# Patient Record
Sex: Female | Born: 1945 | Race: White | Hispanic: No | Marital: Single | Smoking: Former smoker
Health system: Southern US, Community
[De-identification: ages and names within clinical notes are randomized; demographics above are authoritative.]

## PROBLEM LIST (undated history)

## (undated) DIAGNOSIS — J45909 Unspecified asthma, uncomplicated: Secondary | ICD-10-CM

## (undated) DIAGNOSIS — E790 Hyperuricemia without signs of inflammatory arthritis and tophaceous disease: Secondary | ICD-10-CM

## (undated) DIAGNOSIS — F419 Anxiety disorder, unspecified: Secondary | ICD-10-CM

## (undated) DIAGNOSIS — K219 Gastro-esophageal reflux disease without esophagitis: Secondary | ICD-10-CM

## (undated) DIAGNOSIS — N189 Chronic kidney disease, unspecified: Secondary | ICD-10-CM

## (undated) DIAGNOSIS — M810 Age-related osteoporosis without current pathological fracture: Secondary | ICD-10-CM

## (undated) DIAGNOSIS — F32A Depression, unspecified: Secondary | ICD-10-CM

## (undated) DIAGNOSIS — I10 Essential (primary) hypertension: Secondary | ICD-10-CM

## (undated) DIAGNOSIS — E039 Hypothyroidism, unspecified: Secondary | ICD-10-CM

## (undated) DIAGNOSIS — M79609 Pain in unspecified limb: Secondary | ICD-10-CM

## (undated) DIAGNOSIS — F329 Major depressive disorder, single episode, unspecified: Secondary | ICD-10-CM

## (undated) HISTORY — DX: Hypothyroidism, unspecified: E03.9

## (undated) HISTORY — DX: Major depressive disorder, single episode, unspecified: F32.9

## (undated) HISTORY — DX: Hyperuricemia without signs of inflammatory arthritis and tophaceous disease: E79.0

## (undated) HISTORY — PX: TONSILLECTOMY: SHX5217

## (undated) HISTORY — DX: Depression, unspecified: F32.A

## (undated) HISTORY — PX: LUMBAR FUSION: SHX111

## (undated) HISTORY — DX: Age-related osteoporosis without current pathological fracture: M81.0

## (undated) HISTORY — DX: Pain in unspecified limb: M79.609

## (undated) HISTORY — DX: Essential (primary) hypertension: I10

---

## 2012-05-06 ENCOUNTER — Ambulatory Visit: Payer: Self-pay | Admitting: Family Medicine

## 2012-05-14 ENCOUNTER — Ambulatory Visit: Payer: Self-pay | Admitting: Family Medicine

## 2012-08-19 ENCOUNTER — Ambulatory Visit: Payer: Self-pay | Admitting: Gastroenterology

## 2012-08-19 LAB — CBC WITH DIFFERENTIAL/PLATELET
Basophil #: 0.1 10*3/uL (ref 0.0–0.1)
Eosinophil #: 0 10*3/uL (ref 0.0–0.7)
Eosinophil %: 0.2 %
HCT: 38.3 % (ref 35.0–47.0)
HGB: 13.3 g/dL (ref 12.0–16.0)
Lymphocyte #: 1 10*3/uL (ref 1.0–3.6)
Lymphocyte %: 10.1 %
MCHC: 34.6 g/dL (ref 32.0–36.0)
Monocyte %: 6.6 %
Neutrophil %: 82.4 %
RBC: 3.76 10*6/uL — ABNORMAL LOW (ref 3.80–5.20)
RDW: 13.7 % (ref 11.5–14.5)
WBC: 9.8 10*3/uL (ref 3.6–11.0)

## 2012-08-19 LAB — PROTIME-INR
INR: 0.9
Prothrombin Time: 13 secs (ref 11.5–14.7)

## 2012-08-21 LAB — PATHOLOGY REPORT

## 2012-08-28 HISTORY — PX: COLONOSCOPY: SHX174

## 2012-09-18 ENCOUNTER — Other Ambulatory Visit: Payer: Self-pay | Admitting: Gastroenterology

## 2012-09-18 ENCOUNTER — Ambulatory Visit: Payer: Self-pay | Admitting: Surgery

## 2012-09-18 LAB — TSH: Thyroid Stimulating Horm: 0.795 u[IU]/mL

## 2012-09-18 LAB — BASIC METABOLIC PANEL
BUN: 17 mg/dL (ref 7–18)
Chloride: 106 mmol/L (ref 98–107)
Creatinine: 0.9 mg/dL (ref 0.60–1.30)
EGFR (African American): >60
EGFR (Non-African Amer.): >60
Potassium: 3.8 mmol/L (ref 3.5–5.1)

## 2012-09-18 LAB — HEPATIC FUNCTION PANEL A (ARMC)
Albumin: 4.1 g/dL (ref 3.4–5.0)
Alkaline Phosphatase: 74 U/L (ref 50–136)
Bilirubin,Total: 0.2 mg/dL (ref 0.2–1.0)
SGPT (ALT): 187 U/L — ABNORMAL HIGH (ref 12–78)

## 2012-09-18 LAB — CBC WITH DIFFERENTIAL/PLATELET
Basophil #: 0 10*3/uL (ref 0.0–0.1)
Eosinophil %: 1.2 %
HCT: 35.7 % (ref 35.0–47.0)
Lymphocyte #: 1.1 10*3/uL (ref 1.0–3.6)
MCH: 34.3 pg — ABNORMAL HIGH (ref 26.0–34.0)
MCV: 102 fL — ABNORMAL HIGH (ref 80–100)
Monocyte #: 0.5 x10 3/mm (ref 0.2–0.9)
Monocyte %: 6.9 %
Neutrophil #: 5.9 10*3/uL (ref 1.4–6.5)
Platelet: 165 10*3/uL (ref 150–440)
RDW: 13.5 % (ref 11.5–14.5)

## 2012-09-25 ENCOUNTER — Ambulatory Visit: Payer: Self-pay | Admitting: Surgery

## 2012-09-26 HISTORY — PX: OTHER SURGICAL HISTORY: SHX169

## 2013-02-04 ENCOUNTER — Ambulatory Visit: Payer: Self-pay

## 2013-12-27 IMAGING — US SCREENING ULTRASOUND OF ABDOMINAL AORTA
1 series · 14 of 23 positions shown · non-contrast
Comparison: None

REASON FOR EXAM: Welcome to medicare
COMMENTS:

PROCEDURE:     EBADAT - EBADAT AORTA SCREENING/FAMILY HX  - May 06, 2012  [DATE]
RESULT:     Ultrasound aorta
INDICATION: Evaluate for AAA
TECHNIQUE: Multiple gray-scale and color Doppler images of the abdominal
aorta obtained.

[Series 1: screening ultrasound of abdominal aorta · 0.26mm/px · 14 of 23 slices shown]
[im 1/23]
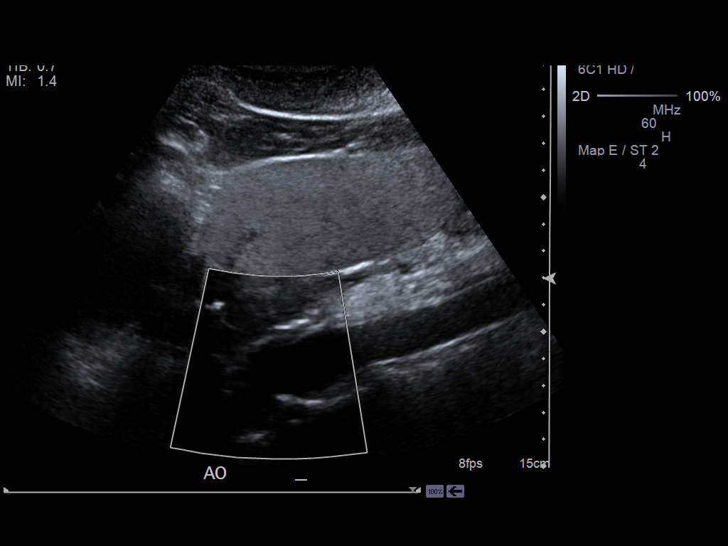
[im 3/23]
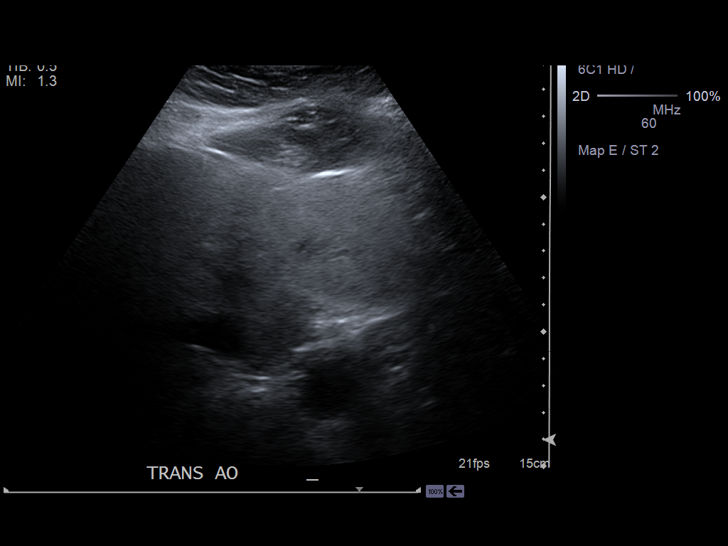
[im 5/23]
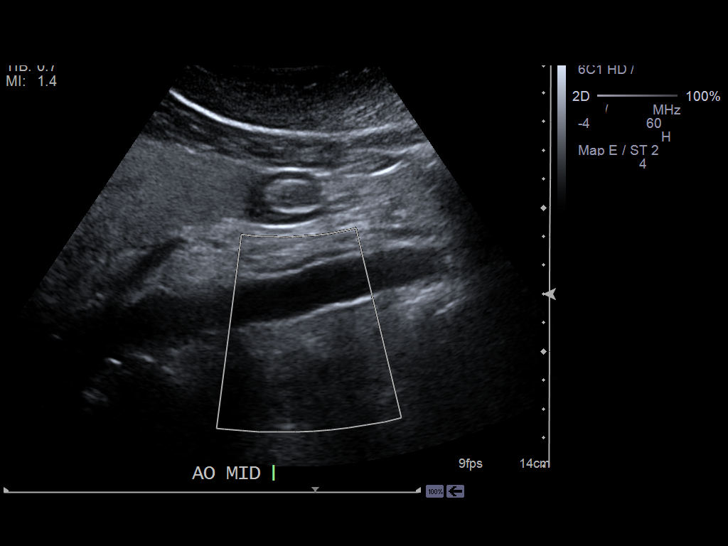
[im 6/23]
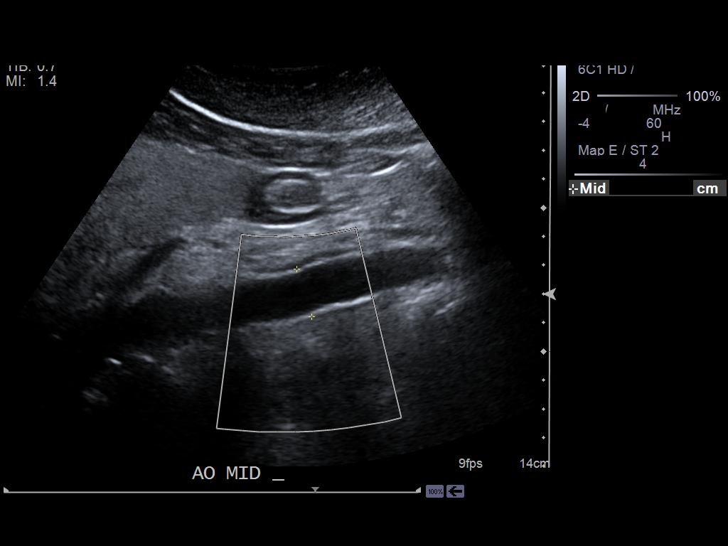
[im 8/23]
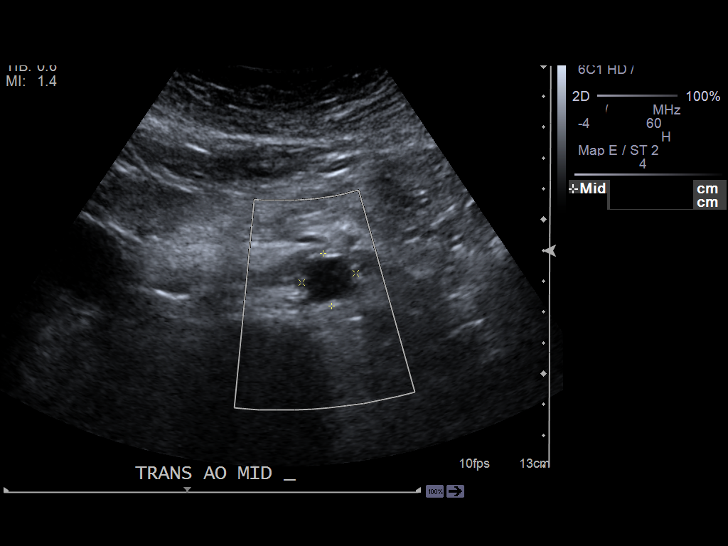
[im 10/23]
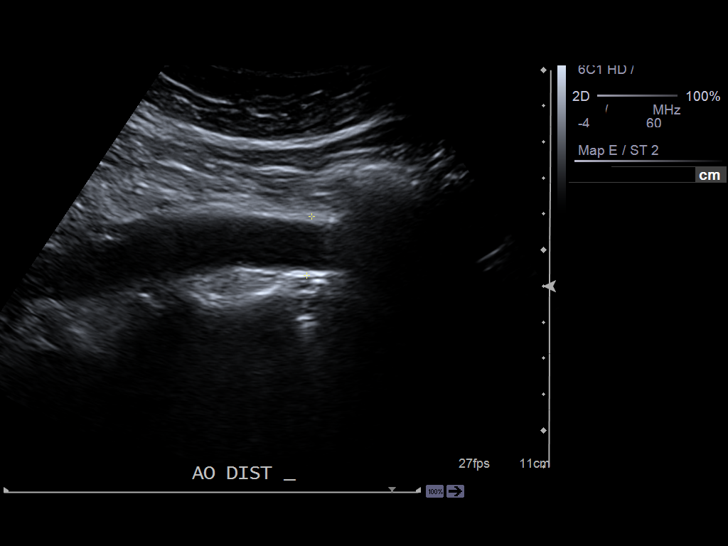
[im 11/23]
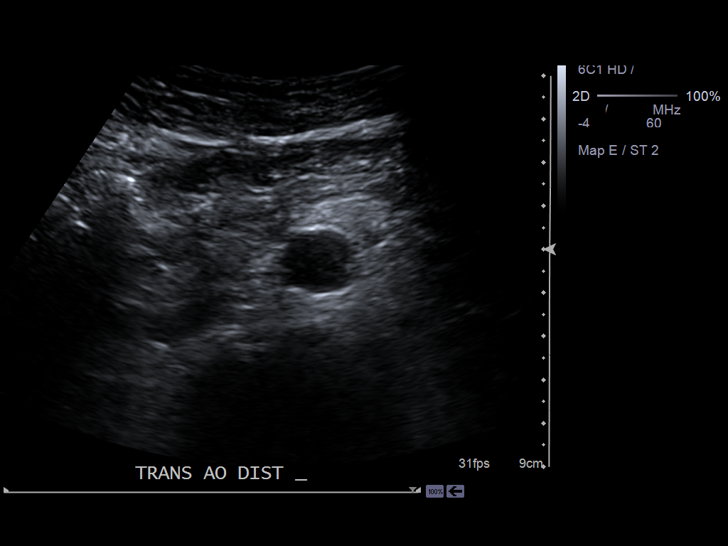
[im 13/23]
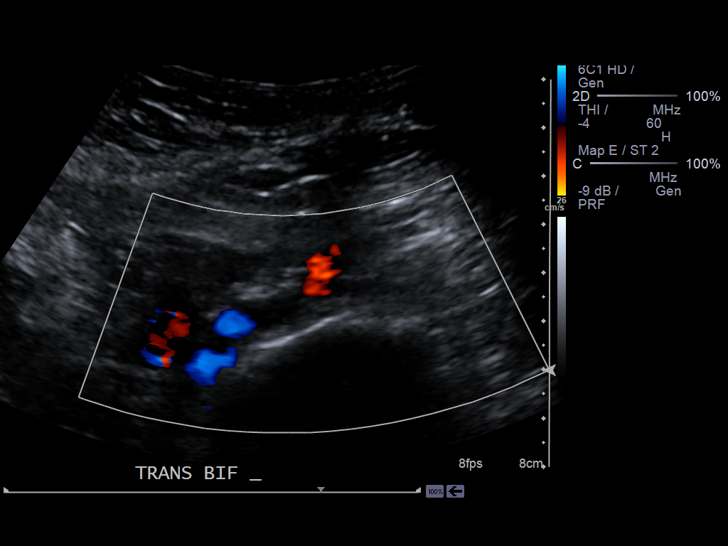
[im 14/23]
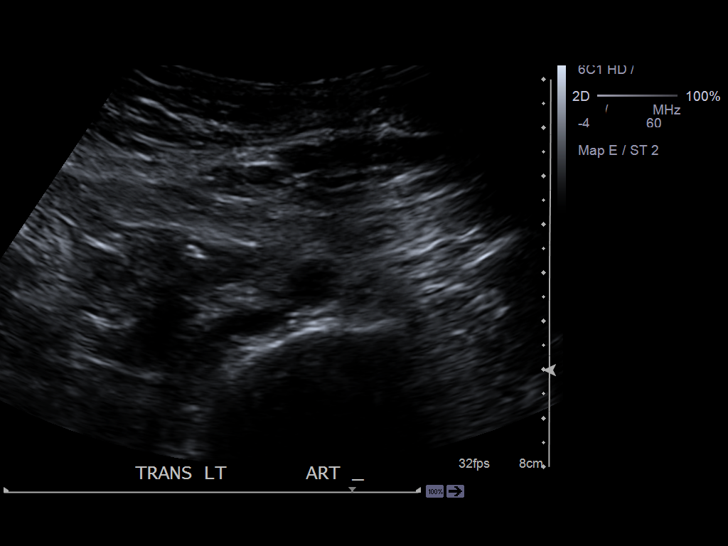
[im 16/23]
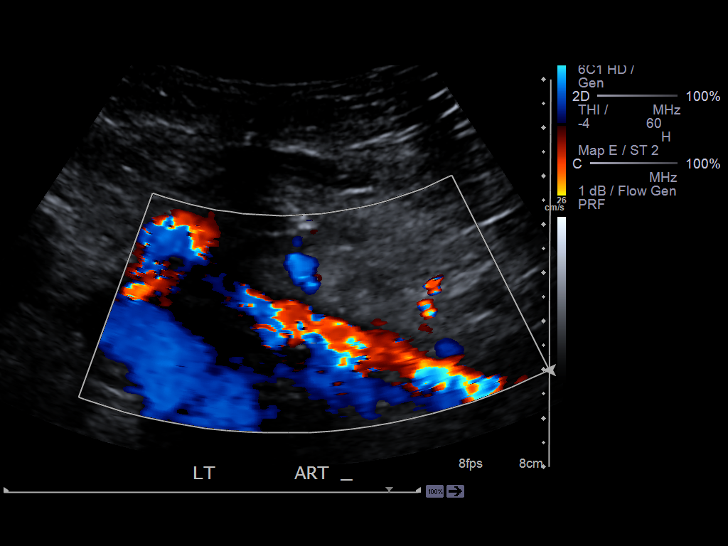
[im 18/23]
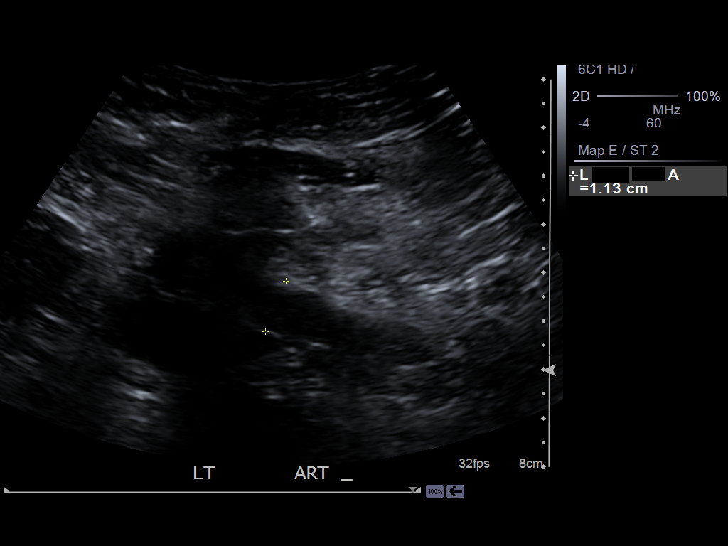
[im 19/23]
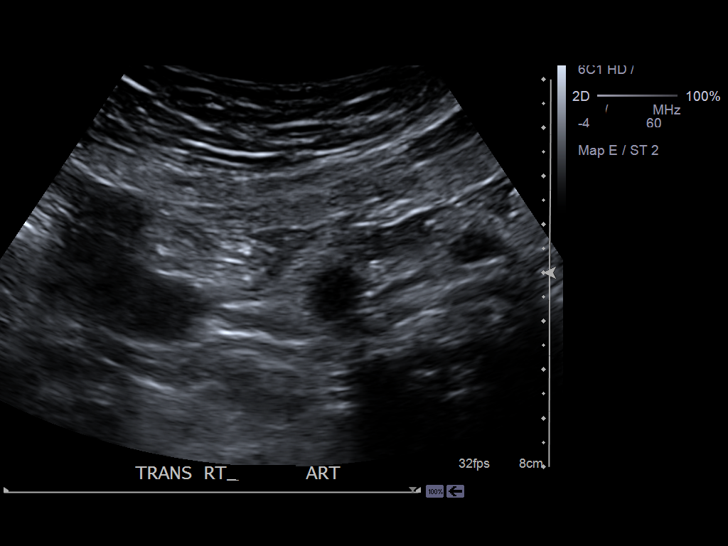
[im 21/23]
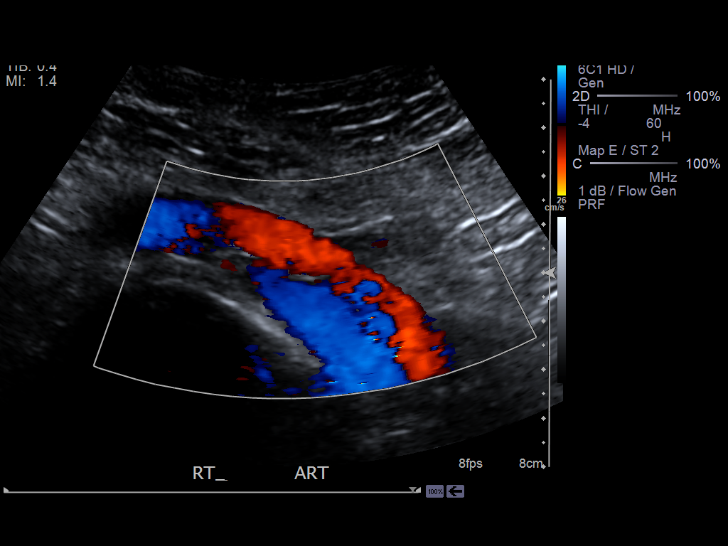
[im 23/23]
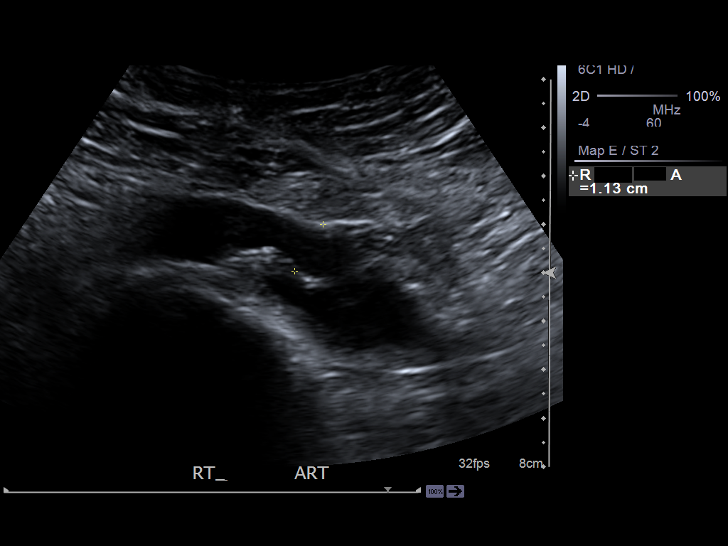

[14 of 23 positions shown; findings below may reference images not displayed]

FINDINGS: There is no evidence of abdominal aortic aneurysm. The proximal
segment of the abdominal aorta measures 2.4 x 2.3 cm, the mid abdominal
aorta measures 1.7 x 1.8 cm, and the distal abdominal aorta measures 1.7 x
1.7 cm in axial dimensions.

The common iliac arteries are normal in caliber without evidence of focal
aneurysmal dilatation. The right common iliac artery measures 1.2 cm. The
left common iliac artery measures 1.2 cm.
IMPRESSION: 1. No sonographic evidence of abdominal aortic aneurysm or aneurysmal
dilatation of the iliac arteries.

[REDACTED]

## 2014-06-15 ENCOUNTER — Ambulatory Visit: Payer: Self-pay | Admitting: Family Medicine

## 2014-11-30 DIAGNOSIS — H524 Presbyopia: Secondary | ICD-10-CM | POA: Diagnosis not present

## 2014-11-30 DIAGNOSIS — H25032 Anterior subcapsular polar age-related cataract, left eye: Secondary | ICD-10-CM | POA: Diagnosis not present

## 2015-03-15 NOTE — Op Note (Signed)
PATIENT NAME:  Erica [name], Erica [name] MR#:  [id number] DATE OF BIRTH:  Aug 02, 1946  DATE OF PROCEDURE:  09/25/2012  PREOPERATIVE DIAGNOSIS: Colonic polyp at the appendiceal orifice.    POSTOPERATIVE DIAGNOSIS: Colonic polyp at the appendiceal orifice.   PROCEDURE: PATIENT NAME: Laparoscopic appendectomy with excision of a wedge of wall of the cecum.  SURGEON: Adella HareJ. Wilton Smith, MD   ANESTHESIA: General.   INDICATIONS: This 69 year old female recently had a colonoscopy with findings of a polyp at the appendiceal orifice. A biopsy was done, however, after the biopsy the polyp retracted up into the appendix. It was no longer seen. Pathology demonstrated tubular adenoma. Resection of the appendix and a wedge of the cecum was recommended for definitive treatment.   DESCRIPTION: The patient was placed on the operating table in the supine position under general anesthesia. The abdomen was prepared with ChloraPrep and draped in a sterile manner.   A short incision was made in the inferior aspect of the umbilicus and carried down through the deep fascia which was grasped with laryngeal hook and elevated. A Veress needle was inserted, aspirated, and irrigated with saline solution. Next, the peritoneal was inflated with carbon dioxide. The Veress needle was removed. The 10 mm cannula was inserted. The 10 mm 0 degree laparoscope was inserted to view the peritoneal cavity. Initial inspection revealed location of the cecum. The liver appeared normal. Other brief survey demonstrated no other abnormalities.   Next, with the patient in the Trendelenburg position another incision was made in the left lower quadrant to introduce a 12 mm cannula lateral to the inferior epigastric vessels. Another incision was made in the right lower quadrant to insert a 5 mm cannula lateral to the inferior epigastric vessels. The cecum was elevated with a Babcock clamp and demonstrated the appendix. The appendix otherwise appeared to be  unremarkable. The terminal ileum was demonstrated and appeared normal. Next, lifting up the appendix a window was created in the mesoappendix and the white cartridge Endo-GIA stapler was placed across the mesoappendix and activated dividing the mesoappendix. Several small bleeding points were cauterized. Hemostasis was subsequently intact. Next, further dissection was carried out and with traction of the appendix a portion of the cecum was removed with the appendix with two loads of the blue Endo-GIA stapler and the appendix was brought out through the 12 mm port site through the port and then the port was removed and pressure held over the port site. The appendix with the wedge of cecum was submitted for routine pathology. The right lower quadrant was further inspected. Just a small amount of blood was aspirated. Hemostasis was intact. Next, the laparoscopic cannulas were removed. Carbon dioxide was allowed to escape from the peritoneal cavity. Several tiny bleeding points in the subcutaneous tissues were cauterized. The skin incisions were closed with interrupted 5-0 chromic subcuticular suture and Dermabond.   The patient tolerated surgery satisfactorily and was then prepared for transfer to the recovery room.   ____________________________ Shela CommonsJ. Renda RollsWilton Smith, MD jws:drc [name]: 09/25/2012 10:39:08 ET T: 09/25/2012 11:11:48 ET JOB#: [id number]  cc: Adella HareJ. Wilton Smith, MD, <Dictator> Adella HareWILTON J SMITH MD ELECTRONICALLY SIGNED 09/25/2012 18:11

## 2015-04-05 DIAGNOSIS — F329 Major depressive disorder, single episode, unspecified: Secondary | ICD-10-CM | POA: Insufficient documentation

## 2015-04-05 DIAGNOSIS — M810 Age-related osteoporosis without current pathological fracture: Secondary | ICD-10-CM | POA: Insufficient documentation

## 2015-04-05 DIAGNOSIS — I10 Essential (primary) hypertension: Secondary | ICD-10-CM | POA: Insufficient documentation

## 2015-04-05 DIAGNOSIS — F32A Depression, unspecified: Secondary | ICD-10-CM | POA: Insufficient documentation

## 2015-04-05 DIAGNOSIS — E039 Hypothyroidism, unspecified: Secondary | ICD-10-CM | POA: Insufficient documentation

## 2015-05-25 ENCOUNTER — Ambulatory Visit (INDEPENDENT_AMBULATORY_CARE_PROVIDER_SITE_OTHER): Payer: Medicare Other | Admitting: Family Medicine

## 2015-05-25 ENCOUNTER — Encounter: Payer: Self-pay | Admitting: Family Medicine

## 2015-05-25 VITALS — BP 152/81 | HR 58 | Temp 98.6°F | Ht 64.0 in | Wt 156.0 lb

## 2015-05-25 DIAGNOSIS — E039 Hypothyroidism, unspecified: Secondary | ICD-10-CM

## 2015-05-25 DIAGNOSIS — Z1239 Encounter for other screening for malignant neoplasm of breast: Secondary | ICD-10-CM

## 2015-05-25 DIAGNOSIS — F32A Depression, unspecified: Secondary | ICD-10-CM

## 2015-05-25 DIAGNOSIS — F329 Major depressive disorder, single episode, unspecified: Secondary | ICD-10-CM

## 2015-05-25 DIAGNOSIS — I10 Essential (primary) hypertension: Secondary | ICD-10-CM

## 2015-05-25 DIAGNOSIS — Z0000 Encounter for general adult medical examination without abnormal findings: Secondary | ICD-10-CM

## 2015-05-25 LAB — URINALYSIS, ROUTINE W REFLEX MICROSCOPIC
Bilirubin, UA: NEGATIVE
Glucose, UA: NEGATIVE
KETONES UA: NEGATIVE
LEUKOCYTES UA: NEGATIVE
NITRITE UA: NEGATIVE
PROTEIN UA: NEGATIVE
SPEC GRAV UA: 1.015 (ref 1.005–1.030)
Urobilinogen, Ur: 0.2 mg/dL (ref 0.2–1.0)
pH, UA: 6 (ref 5.0–7.5)

## 2015-05-25 LAB — MICROSCOPIC EXAMINATION
BACTERIA UA: NONE SEEN
WBC, UA: NONE SEEN /hpf (ref 0–?)

## 2015-05-25 MED ORDER — FLUTICASONE PROPIONATE 50 MCG/ACT NA SUSP: 2.0000 | Freq: Every day | NASAL | Status: AC

## 2015-05-25 MED ORDER — HYDROCHLOROTHIAZIDE 25 MG PO TABS: 25.0000 mg | ORAL_TABLET | Freq: Every day | ORAL | Status: DC

## 2015-05-25 MED ORDER — LEVOTHYROXINE SODIUM 75 MCG PO TABS: 75.0000 ug | ORAL_TABLET | Freq: Every day | ORAL | Status: AC

## 2015-05-25 MED ORDER — TRAZODONE HCL 50 MG PO TABS: 50.0000 mg | ORAL_TABLET | Freq: Every day | ORAL | Status: AC

## 2015-05-25 MED ORDER — METOPROLOL SUCCINATE ER 100 MG PO TB24: 100.0000 mg | ORAL_TABLET | Freq: Every day | ORAL | Status: DC

## 2015-05-25 MED ORDER — OMEPRAZOLE 20 MG PO CPDR: 20.0000 mg | DELAYED_RELEASE_CAPSULE | Freq: Every day | ORAL | Status: AC

## 2015-05-25 MED ORDER — BENAZEPRIL HCL 40 MG PO TABS: 40.0000 mg | ORAL_TABLET | Freq: Every day | ORAL | Status: DC

## 2015-05-25 MED ORDER — CITALOPRAM HYDROBROMIDE 10 MG PO TABS: 10.0000 mg | ORAL_TABLET | Freq: Every day | ORAL | Status: DC

## 2015-05-25 NOTE — Assessment & Plan Note (Signed)
The current medical regimen is effective;  continue present plan and medications.  

## 2015-05-25 NOTE — Assessment & Plan Note (Signed)
Discuss BP not controled  Previously on meds doing well will work on diet and exercise And track if not better recheck

## 2015-05-25 NOTE — Addendum Note (Signed)
Addended byVonita Moss: Angelica Frandsen on: 05/25/2015 10:44 AM   Modules accepted: Orders, SmartSet

## 2015-05-25 NOTE — Progress Notes (Signed)
BP 152/81 mmHg  Pulse 58  Temp(Src) 98.6 F (37 C)  Ht  (1.626 m)  Wt 156 lb (70.761 kg)  BMI 26.76 kg/m2  SpO2 99%   Subjective:    Patient ID: Erica [name], female    DOB: [birth year], 69 y.o.   MRN: [id number]  HPI: Erica [name] is a 69 y.o. female  Chief Complaint  Patient presents with  . Annual Exam  BP is elevated today Doing well takes meds today and every day No side effects Depression is controled on meds Thyroid no sx And good sleep  Moving to [facility] next mo   Relevant past medical, surgical, family and social history reviewed and updated as indicated. Interim medical history since our last visit reviewed. Allergies and medications reviewed and updated.  Review of Systems  Constitutional: Negative.   HENT: Negative.   Eyes: Negative.   Respiratory: Negative.   Cardiovascular: Negative.   Gastrointestinal: Negative.   Endocrine: Negative.   Genitourinary: Negative.   Musculoskeletal: Negative.   Skin: Negative.   Allergic/Immunologic: Negative.   Neurological: Negative.   Hematological: Negative.   Psychiatric/Behavioral: Negative.     Per HPI unless specifically indicated above     Objective:    BP 152/81 mmHg  Pulse 58  Temp(Src) 98.6 F (37 C)  Ht  (1.626 m)  Wt 156 lb (70.761 kg)  BMI 26.76 kg/m2  SpO2 99%  Wt Readings from Last 3 Encounters:  05/25/15 156 lb (70.761 kg)  11/09/14 154 lb (69.854 kg)    Physical Exam  Constitutional: She is oriented to person, place, and time. She appears well-developed and well-nourished.  HENT:  Head: Normocephalic and atraumatic.  Right Ear: External ear normal.  Left Ear: External ear normal.  Nose: Nose normal.  Mouth/Throat: Oropharynx is clear and moist.  Eyes: Conjunctivae and EOM are normal. Pupils are equal, round, and reactive to light.  Neck: Normal range of motion. Neck supple. Carotid bruit is not present.  Cardiovascular: Normal rate, regular rhythm and normal heart  sounds.   No murmur heard. Pulmonary/Chest: Effort normal and breath sounds normal. Right breast exhibits no mass and no tenderness. Left breast exhibits no mass and no tenderness. Breasts are symmetrical.  Abdominal: Soft. Bowel sounds are normal. There is no hepatosplenomegaly.  Musculoskeletal: Normal range of motion.  Neurological: She is alert and oriented to person, place, and time.  Skin: No rash noted.  Psychiatric: She has a normal mood and affect. Her behavior is normal. Judgment and thought content normal.        Assessment & Plan:   Problem List Items Addressed This Visit      Cardiovascular and Mediastinum   Hypertension - Primary    Discuss BP not controled  Previously on meds doing well will work on diet and exercise And track if not better recheck      Relevant Medications   benazepril (LOTENSIN) 40 MG tablet   hydrochlorothiazide (HYDRODIURIL) 25 MG tablet   metoprolol succinate (TOPROL-XL) 100 MG 24 hr tablet   Other Relevant Orders   Comprehensive metabolic panel   CBC with Differential/Platelet   Urinalysis, Routine w reflex microscopic (not at [facility])   Lipid panel     Endocrine   Hypothyroid    The current medical regimen is effective;  continue present plan and medications.       Relevant Medications   levothyroxine (SYNTHROID, LEVOTHROID) 75 MCG tablet   metoprolol succinate (TOPROL-XL) 100 MG 24  hr tablet   Other Relevant Orders   Comprehensive metabolic panel   CBC with Differential/Platelet   Urinalysis, Routine w reflex microscopic ([facility])   TSH   Lipid panel     Other   Depression    The current medical regimen is effective;  continue present plan and medications.       Relevant Medications   citalopram (CELEXA) 10 MG tablet   traZODone (DESYREL) 50 MG tablet   Other Relevant Orders   Comprehensive metabolic panel   CBC with Differential/Platelet   Urinalysis, Routine w reflex microscopic (not [facility])   Lipid panel        Follow up plan: Return for no f/u schedualed as moving will need BMP for med check in 6 mo.

## 2015-05-26 ENCOUNTER — Telehealth: Payer: Self-pay | Admitting: Family Medicine

## 2015-05-26 DIAGNOSIS — E781 Pure hyperglyceridemia: Secondary | ICD-10-CM

## 2015-05-26 LAB — COMPREHENSIVE METABOLIC PANEL
ALT: 54 IU/L — ABNORMAL HIGH (ref 0–32)
AST: 62 IU/L — ABNORMAL HIGH (ref 0–40)
Albumin/Globulin Ratio: 1.9 (ref 1.1–2.5)
Albumin: 4.3 g/dL (ref 3.6–4.8)
Alkaline Phosphatase: 74 IU/L (ref 39–117)
BUN/Creatinine Ratio: 28 — ABNORMAL HIGH (ref 11–26)
BUN: 23 mg/dL (ref 8–27)
Bilirubin Total: 0.4 mg/dL (ref 0.0–1.2)
CO2: 26 mmol/L (ref 18–29)
Calcium: 9.6 mg/dL (ref 8.7–10.3)
Chloride: 98 mmol/L (ref 97–108)
Creatinine, Ser: 0.81 mg/dL (ref 0.57–1.00)
GFR calc Af Amer: 86 mL/min/{1.73_m2} (ref >59)
GFR calc non Af Amer: 75 mL/min/{1.73_m2} (ref >59)
Globulin, Total: 2.3 g/dL (ref 1.5–4.5)
Glucose: 98 mg/dL (ref 65–99)
Potassium: 4.5 mmol/L (ref 3.5–5.2)
Sodium: 139 mmol/L (ref 134–144)
Total Protein: 6.6 g/dL (ref 6.0–8.5)

## 2015-05-26 LAB — CBC WITH DIFFERENTIAL/PLATELET
BASOS ABS: 0 10*3/uL (ref 0.0–0.2)
Basos: 1 %
EOS (ABSOLUTE): 0.2 10*3/uL (ref 0.0–0.4)
Eos: 3 %
HEMATOCRIT: 40.6 % (ref 34.0–46.6)
Hemoglobin: 12.9 g/dL (ref 11.1–15.9)
IMMATURE GRANS (ABS): 0 10*3/uL (ref 0.0–0.1)
Immature Granulocytes: 0 %
Lymphocytes Absolute: 1.8 10*3/uL (ref 0.7–3.1)
Lymphs: 33 %
MCH: 33.2 pg — ABNORMAL HIGH (ref 26.6–33.0)
MCHC: 31.8 g/dL (ref 31.5–35.7)
MCV: 104 fL — ABNORMAL HIGH (ref 79–97)
MONOS ABS: 0.5 10*3/uL (ref 0.1–0.9)
Monocytes: 8 %
Neutrophils Absolute: 3 10*3/uL (ref 1.4–7.0)
Neutrophils: 55 %
Platelets: 213 10*3/uL (ref 150–379)
RBC: 3.89 x10E6/uL (ref 3.77–5.28)
RDW: 13.7 % (ref 12.3–15.4)
WBC: 5.5 10*3/uL (ref 3.4–10.8)

## 2015-05-26 LAB — LIPID PANEL
Chol/HDL Ratio: 2.9 ratio units (ref 0.0–4.4)
Cholesterol, Total: 207 mg/dL — ABNORMAL HIGH (ref 100–199)
HDL: 72 mg/dL (ref >39)
TRIGLYCERIDES: 558 mg/dL — AB (ref 0–149)

## 2015-05-26 LAB — TSH: TSH: 0.702 u[IU]/mL (ref 0.450–4.500)

## 2015-05-26 NOTE — Telephone Encounter (Signed)
-----   Message from Lurlean HornsNancy H Wilson, CMA sent at 05/26/2015 12:24 PM EDT ----- labs

## 2015-05-26 NOTE — Telephone Encounter (Signed)
Phone call discuss trig too high non fasting labs Discuss diet etoh F/u lipid panel next mo here or in new home.

## 2015-06-13 ENCOUNTER — Telehealth: Payer: Self-pay

## 2015-06-13 MED ORDER — HYDROCHLOROTHIAZIDE 25 MG PO TABS
25.0000 mg | ORAL_TABLET | Freq: Every day | ORAL | Status: DC
Start: 2015-06-13 — End: 2024-05-18

## 2015-06-13 NOTE — Telephone Encounter (Signed)
Needs temp. Rx for HCTZ, mail order delay [facility] [place]

## 2015-11-23 DIAGNOSIS — J982 Interstitial emphysema: Secondary | ICD-10-CM | POA: Diagnosis not present

## 2015-11-23 DIAGNOSIS — S0232XA Fracture of orbital floor, left side, initial encounter for closed fracture: Secondary | ICD-10-CM | POA: Diagnosis not present

## 2015-11-23 DIAGNOSIS — S0230XA Fracture of orbital floor, unspecified side, initial encounter for closed fracture: Secondary | ICD-10-CM | POA: Diagnosis not present

## 2015-11-23 DIAGNOSIS — T797XXA Traumatic subcutaneous emphysema, initial encounter: Secondary | ICD-10-CM | POA: Diagnosis not present

## 2015-11-23 DIAGNOSIS — I10 Essential (primary) hypertension: Secondary | ICD-10-CM | POA: Diagnosis not present

## 2015-11-23 DIAGNOSIS — J329 Chronic sinusitis, unspecified: Secondary | ICD-10-CM | POA: Diagnosis not present

## 2015-11-23 DIAGNOSIS — Z79899 Other long term (current) drug therapy: Secondary | ICD-10-CM | POA: Diagnosis not present

## 2015-11-23 DIAGNOSIS — R220 Localized swelling, mass and lump, head: Secondary | ICD-10-CM | POA: Diagnosis not present

## 2015-11-23 DIAGNOSIS — E079 Disorder of thyroid, unspecified: Secondary | ICD-10-CM | POA: Diagnosis not present

## 2016-05-30 NOTE — Telephone Encounter (Signed)
[facility]

## 2017-04-04 ENCOUNTER — Telehealth: Payer: Self-pay | Admitting: Family Medicine

## 2017-04-04 NOTE — Telephone Encounter (Signed)
Called Pt to schedule AWV with [facility] - knb °

## 2017-06-03 ENCOUNTER — Telehealth: Payer: Self-pay | Admitting: Family Medicine

## 2017-06-03 NOTE — Telephone Encounter (Signed)
Called pt to schedule Annual Wellness Visit with [facility]  - knb  °

## 2017-06-19 ENCOUNTER — Telehealth: Payer: Self-pay | Admitting: Family Medicine

## 2017-06-19 NOTE — Telephone Encounter (Signed)
Called pt to schedule Annual Wellness Visit with [facility]  - knb  °

## 2022-03-19 ENCOUNTER — Telehealth: Payer: Self-pay

## 2022-03-19 NOTE — Telephone Encounter (Signed)
Called and left vm returning patient phone call in regards to a new patient appointment. ?

## 2022-03-29 ENCOUNTER — Other Ambulatory Visit: Payer: Self-pay | Admitting: Internal Medicine

## 2022-03-29 DIAGNOSIS — I447 Left bundle-branch block, unspecified: Secondary | ICD-10-CM

## 2022-03-29 DIAGNOSIS — R0602 Shortness of breath: Secondary | ICD-10-CM

## 2022-04-03 ENCOUNTER — Ambulatory Visit
Admission: RE | Admit: 2022-04-03 | Discharge: 2022-04-03 | Disposition: A | Discharge status: Home / Self Care | Payer: Self-pay | Source: Ambulatory Visit | Attending: Internal Medicine | Admitting: Internal Medicine

## 2022-04-03 DIAGNOSIS — R0602 Shortness of breath: Secondary | ICD-10-CM | POA: Insufficient documentation

## 2022-04-03 DIAGNOSIS — I447 Left bundle-branch block, unspecified: Secondary | ICD-10-CM | POA: Insufficient documentation

## 2022-05-11 ENCOUNTER — Other Ambulatory Visit: Payer: Self-pay | Admitting: Internal Medicine

## 2022-05-11 DIAGNOSIS — Z1231 Encounter for screening mammogram for malignant neoplasm of breast: Secondary | ICD-10-CM

## 2022-05-11 DIAGNOSIS — M810 Age-related osteoporosis without current pathological fracture: Secondary | ICD-10-CM

## 2022-05-31 ENCOUNTER — Other Ambulatory Visit: Payer: Self-pay | Admitting: Internal Medicine

## 2022-05-31 DIAGNOSIS — Z139 Encounter for screening, unspecified: Secondary | ICD-10-CM

## 2022-06-04 ENCOUNTER — Ambulatory Visit
Admission: RE | Admit: 2022-06-04 | Discharge: 2022-06-04 | Disposition: A | Discharge status: Home / Self Care | Payer: Medicare HMO | Source: Ambulatory Visit | Attending: Internal Medicine | Admitting: Internal Medicine

## 2022-06-04 ENCOUNTER — Other Ambulatory Visit: Payer: Medicare HMO

## 2022-06-04 DIAGNOSIS — Z139 Encounter for screening, unspecified: Secondary | ICD-10-CM

## 2022-06-07 ENCOUNTER — Other Ambulatory Visit: Payer: Medicare HMO

## 2022-06-22 ENCOUNTER — Ambulatory Visit: Payer: Medicare HMO | Attending: Internal Medicine

## 2022-06-29 ENCOUNTER — Ambulatory Visit: Payer: Medicare HMO

## 2022-07-06 ENCOUNTER — Ambulatory Visit: Payer: Medicare HMO

## 2022-09-07 ENCOUNTER — Ambulatory Visit
Admission: RE | Admit: 2022-09-07 | Discharge: 2022-09-07 | Disposition: A | Discharge status: Home / Self Care | Payer: Medicare HMO | Source: Ambulatory Visit | Attending: Internal Medicine | Admitting: Internal Medicine

## 2022-09-07 ENCOUNTER — Other Ambulatory Visit: Payer: Medicare HMO

## 2022-09-07 DIAGNOSIS — M810 Age-related osteoporosis without current pathological fracture: Secondary | ICD-10-CM

## 2023-01-16 ENCOUNTER — Ambulatory Visit: Payer: Medicare HMO

## 2023-01-23 ENCOUNTER — Ambulatory Visit: Payer: Medicare HMO

## 2023-01-30 ENCOUNTER — Ambulatory Visit: Payer: Medicare HMO

## 2023-02-06 ENCOUNTER — Ambulatory Visit: Payer: Medicare HMO

## 2023-02-13 ENCOUNTER — Other Ambulatory Visit: Payer: Self-pay

## 2023-02-13 ENCOUNTER — Ambulatory Visit: Payer: Medicare HMO | Attending: Internal Medicine

## 2023-02-13 DIAGNOSIS — R278 Other lack of coordination: Secondary | ICD-10-CM | POA: Insufficient documentation

## 2023-02-13 DIAGNOSIS — R293 Abnormal posture: Secondary | ICD-10-CM | POA: Insufficient documentation

## 2023-02-13 DIAGNOSIS — N393 Stress incontinence (female) (male): Secondary | ICD-10-CM | POA: Insufficient documentation

## 2023-02-13 DIAGNOSIS — M6281 Muscle weakness (generalized): Secondary | ICD-10-CM | POA: Insufficient documentation

## 2023-02-13 NOTE — Therapy (Signed)
OUTPATIENT PHYSICAL THERAPY FEMALE PELVIC EVALUATION   Patient Name: Erica [name] MRN: [id number] DOB:1946/05/10, 77 y.o., female Today's Date: 02/13/2023  END OF SESSION:  PT End of Session - 02/13/23 1157     Visit Number 1    Number of Visits 9    Date for PT Re-Evaluation 04/14/23    Authorization Type Medicare Advantage    Progress Note Due on Visit 10    PT Start Time 1146    PT Stop Time 1230    PT Time Calculation (min) 44 min    Activity Tolerance Patient tolerated treatment well    Behavior During Therapy [facility] for tasks assessed/performed             Past Medical History:  Diagnosis Date   Depression    Elevated uric acid in blood    Hypertension    Hypothyroid    Osteoporosis    Pain in limb    Past Surgical History:  Procedure Laterality Date   COLONOSCOPY  08/28/12   Needs repeat 3 years   laproscopic appendectomy  09/2012   tubular adenoma   TONSILLECTOMY     Patient Active Problem List   Diagnosis Date Noted   Osteoporosis    Hypertension    Depression    Hypothyroid     PCP: Dr. Farrel Gordon  REFERRING PROVIDER: Dr. Farrel Gordon  REFERRING DIAG: SUI referral date 12/11/22  THERAPY DIAG:  Stress incontinence of urine  Other lack of coordination  Muscle weakness (generalized)  Abnormal posture  Rationale for Evaluation and Treatment: Rehabilitation  ONSET DATE: 12/11/22 referral date   SUBJECTIVE:                                                                                                                                                                                           SUBJECTIVE STATEMENT: URINARY: Pt reported SUI began quite awhile ago, she's noticed it more in the last two years. She would have leakage with coughing and sneezing. Then she began to have urgency while brushing her teeth. Pt has leakage with lifting and when getting up at night (STS txf). Pt experiences nocturia approx. 0-1x/night. Pt feels  like she's fully emptying bladder. Pt used to work at [employer] outside and could hold urine all day. Pt voids every few hours on average. Pt denied pain with urination. Pt wears one pad per day, sometimes two if leakage. Pt describes leakage as a dribble mostly and sometimes a gush. She used to wear a thin pantyliner but now wears a thicker pad. BOWEL: pt is now getting urgency with bowel movements and has leaked  as she felt she had to pass gas but it was stool. Pt has 1-2 bowel movements a day. Sometimes stool is runny. Pt only has pain with bowel movements with straining. Pt feels she's fully emptying most of the time.  SEXUAL FUNCTION: pt has hx of pain with OBGYN and pain with tampon insertion. Pt reports intermittent pain with intercourse but has not had intercourse in years.  CORE STABILITY: hx of lumbar fusion surgery, she had insertion points front and back. Pt denied hx of MVA.   Fluid intake: Yes: pt drinks iced tea (hibiscus with lipton mix) all day long. 1-2 glasses of white wine at night. Grapefruit juice on and off. No coffee and rarely has soda. Pt does not usually drink water.     PAIN:  Are you having pain? No NPRS scale: 0/10 Pain location:  n/a  Pain type:  Pain description:   Aggravating factors:  Relieving factors:   PRECAUTIONS: None  WEIGHT BEARING RESTRICTIONS: No  FALLS:  Has patient fallen in last 6 months? No  LIVING ENVIRONMENT: Lives with: lives alone Lives in: House/apartment Stairs: Yes: External: 3 steps; none Has following equipment at home: None  OCCUPATION: works from home.  PLOF: Independent  PATIENT GOALS: I don't want to wear a pad. Stop leakage.  PERTINENT HISTORY:  HTN, hypothyroid, osteoporsis, depression, hx of appendix removal, lumbar fusion  Sexual abuse: No  BOWEL MOVEMENT: Pain with bowel movement: No Type of bowel movement:Type (Bristol Stool Scale) types 3-4 75% of the time and types 6 and 7 25% of the time.  Fully empty  rectum: Yes:   Leakage: Yes: minimal Pads: Yes: 1-2 pads Fiber supplement: No  URINATION: Pain with urination: No Fully empty bladder: Yes:   Stream: Strong Urgency: Yes: when brushing teeth, performing STS txf and getting home from driving. Frequency: every few hours Leakage: Urge to void, Walking to the bathroom, Coughing, Sneezing, Laughing, and Lifting Pads: Yes: 1-2 pads/day  INTERCOURSE: Pain with intercourse: Initial Penetration Ability to have vaginal penetration:  Yes: but hasn't had intercourse in years Climax: n/a Marinoff Scale: 1/3  PREGNANCY: Vaginal deliveries: 4 Tearing Yes: unsure how many times she had tears.  Fast delivery. C-section deliveries 0 Currently pregnant No  PROLAPSE: Pt was told she had a uterine prolapse when she was having children.    OBJECTIVE:   DIAGNOSTIC FINDINGS:   PATIENT SURVEYS:    COGNITION: Overall cognitive status: Within functional limits for tasks assessed  But pt has noticed issues with memory and feels it's stressed related and decr. Sleep.     SENSATION: Light touch: Appears intact but does have leg cramps Proprioception: Appears intact   GAIT: Distance walked: decr. Trunk rotation and stride length. Assistive device utilized: None Level of assistance: Complete Independence Comments: back to exam room   POSTURE:  pt with R tx convex curve with pt stating hx of scoliosis and spondylosis. L hip elevated in standing, decr. Lx lordosis.  PELVIC ALIGNMENT: see above  LUMBARAROM/PROM: not tested 2/2 time constraints  A/PROM A/PROM  eval  Flexion   Extension   Right lateral flexion   Left lateral flexion   Right rotation   Left rotation    (Blank rows = not tested)  LOWER EXTREMITY ROM:  Active ROM Right eval Left eval  Hip flexion    Hip extension    Hip abduction    Hip adduction    Hip internal rotation    Hip external rotation  Knee flexion    Knee extension    Ankle dorsiflexion    Ankle  plantarflexion    Ankle inversion    Ankle eversion     (Blank rows = not tested)  LOWER EXTREMITY MMT:  MMT Right eval Left eval  Hip flexion    Hip extension    Hip abduction    Hip adduction    Hip internal rotation    Hip external rotation    Knee flexion    Knee extension    Ankle dorsiflexion    Ankle plantarflexion    Ankle inversion    Ankle eversion     PALPATION:  PELVIC MMT:   MMT eval  Vaginal   Internal Anal Sphincter   External Anal Sphincter   Puborectalis   Diastasis Recti   (Blank rows = not tested)        TONE: Not tested  PROLAPSE: Not tested  TODAY'S TREATMENT:                                                                                                                              DATE: 02/13/23  EVAL    PATIENT EDUCATION:  Education details: PT educated pt on POC, frequency and duration. PT educated pt on role of PFM and IAP. PT educated pt on proper posture (seated and toileting), PT educated pt on benefits of drinking water first upon waking. Person educated: Patient Education method: Explanation, Demonstration, Verbal cues, and Handouts Education comprehension: verbalized understanding, returned demonstration, and needs further education  HOME EXERCISE PROGRAM: Not yet established.  ASSESSMENT:  CLINICAL IMPRESSION: Patient is a pleasant 77 y.o. female who was seen today for physical therapy evaluation and treatment for SUI. Pt's PMH is significant for the following: HTN, hypothyroid, osteoporsis, depression, hx of appendix removal, lumbar fusion, scoliosis and spondylosis (per pt). The following impairments noted during exam: urge and stress urinary incontinence, impaired SLS balance, weakness likely 2/2 gait deviations, decr. Endurance, decr. ROM. Pt would benefit from skilled PT to address impairments listed above to improve safety and QOL during all ADLs.  OBJECTIVE IMPAIRMENTS: Abnormal gait, decreased balance, decreased  coordination, decreased endurance, decreased mobility, decreased ROM, decreased strength, hypomobility, impaired flexibility, and postural dysfunction.   ACTIVITY LIMITATIONS: carrying, lifting, bending, sitting, standing, squatting, sleeping, transfers, continence, toileting, and locomotion level  PARTICIPATION LIMITATIONS: meal prep, cleaning, laundry, community activity, occupation, and yard work  PERSONAL FACTORS: Age, Fitness, and 3+ comorbidities: see above  are also affecting patient's functional outcome.   REHAB POTENTIAL: Good  CLINICAL DECISION MAKING: Stable/uncomplicated  EVALUATION COMPLEXITY: Low   GOALS: Goals reviewed with patient? Yes  SHORT TERM GOALS: Target date: 03/13/23  Pt will be IND in HEP to improve strength, balance and ROM.  Baseline: No HEP Goal status: INITIAL  2.  Complete exam and write goals as indicated. Baseline: limited 2/2 time constraints. Goal status: INITIAL  3.  Perform FOTO and write goals as indicated.  Baseline: limited 2/2 time constraints. Goal status: INITIAL  4.  Pt will demo proper posture in seated and toileing posture to fully empty bladder and reduce strain. Baseline: unable to demo Goal status: INITIAL  LONG TERM GOALS: Target date: 04/10/23  Pt will demonstrate improved PFM coordination with breath during relaxation and contraction in order to improve IAP. Baseline: unable to demo Goal status: INITIAL  2.  Pt will demonstrate improved PFM contraction with breath coordination in order to report wearing 0 pads 2/2 leakage. Baseline: 1-2 pad/day Goal status: INITIAL   PLAN:  PT FREQUENCY: 1x/week  PT DURATION: 8 weeks  PLANNED INTERVENTIONS: Therapeutic exercises, Therapeutic activity, Neuromuscular re-education, Balance training, Gait training, Patient/Family education, Self Care, Joint mobilization, DME instructions, Dry Needling, Spinal mobilization, Cryotherapy, Moist heat, scar mobilization, Biofeedback, Manual  therapy, and Re-evaluation  PLAN FOR NEXT SESSION: finish exam (MMT, ROM, palpation, DR), initiate HEP and review posture.    Treven Holtman L, PT 02/13/2023, 11:58 AM  Geoffry Paradise, PT,DPT 02/13/23 11:58 AM Phone: [phone] Fax: [phone]

## 2023-02-13 NOTE — Patient Instructions (Signed)
TOILET POSTURE: Urination: feet flat, lean forward with forearms on legs to fully empty bladder. Bowel movement: place feet flat on Squatty Potty or stool so knees are higher than hips, lean forward to relax pelvic floor in order to avoid strain.  WATER: Drink 8 ounces of water first thing in the morning.  POSTURE: Try to not cross legs at ankle or knees.

## 2023-02-20 ENCOUNTER — Ambulatory Visit: Payer: Medicare HMO

## 2023-02-20 ENCOUNTER — Other Ambulatory Visit: Payer: Self-pay

## 2023-02-20 DIAGNOSIS — R278 Other lack of coordination: Secondary | ICD-10-CM

## 2023-02-20 DIAGNOSIS — R293 Abnormal posture: Secondary | ICD-10-CM

## 2023-02-20 DIAGNOSIS — N393 Stress incontinence (female) (male): Secondary | ICD-10-CM

## 2023-02-20 DIAGNOSIS — M6281 Muscle weakness (generalized): Secondary | ICD-10-CM

## 2023-02-20 NOTE — Patient Instructions (Signed)
Sidelying: breath into sides and back, using hand to palpate. Performd HA breath to activate external ab muscles. 5 reps each side.   Lying on your back: Perform HA breath to activate external ab muscles by inhaling, then exhaling HA and bringing hand to the outside of opposite knee. 5 reps each side.

## 2023-02-20 NOTE — Therapy (Signed)
OUTPATIENT PHYSICAL THERAPY FEMALE PELVIC TREATMENT   Patient Name: Erica [name] MRN: [id number] DOB:10-11-1946, 77 y.o., female Today's Date: 02/20/2023  END OF SESSION:  PT End of Session - 02/20/23 1017     Visit Number 2    Number of Visits 9    Date for PT Re-Evaluation 04/14/23    Authorization Type Medicare Advantage    Progress Note Due on Visit 10    PT Start Time 1015    PT Stop Time 1055    PT Time Calculation (min) 40 min    Activity Tolerance Patient tolerated treatment well    Behavior During Therapy [facility] for tasks assessed/performed             Past Medical History:  Diagnosis Date   Depression    Elevated uric acid in blood    Hypertension    Hypothyroid    Osteoporosis    Pain in limb    Past Surgical History:  Procedure Laterality Date   COLONOSCOPY  08/28/12   Needs repeat 3 years   laproscopic appendectomy  09/2012   tubular adenoma   TONSILLECTOMY     Patient Active Problem List   Diagnosis Date Noted   Osteoporosis    Hypertension    Depression    Hypothyroid     PCP: Dr. Farrel Gordon  REFERRING PROVIDER: Dr. Farrel Gordon  REFERRING DIAG: SUI referral date 12/11/22  THERAPY DIAG:  Stress incontinence of urine  Other lack of coordination  Muscle weakness (generalized)  Abnormal posture  Rationale for Evaluation and Treatment: Rehabilitation  ONSET DATE: 12/11/22 referral date   SUBJECTIVE:                                                                                                                                                                                           SUBJECTIVE STATEMENT: Pt self-corrected her posture in seated position today (uncrossed her LEs at ankles). Pt reported leakage hasn't been bad. She does not have a squatty potty/stool yet.   PAIN:  Are you having pain? No 02/20/23 NPRS scale: 0/10 Pain location:  n/a  Pain type:  Pain description:   Aggravating factors:  Relieving  factors:   PRECAUTIONS: None  WEIGHT BEARING RESTRICTIONS: No  FALLS:  Has patient fallen in last 6 months? No  LIVING ENVIRONMENT: Lives with: lives alone Lives in: House/apartment Stairs: Yes: External: 3 steps; none Has following equipment at home: None  OCCUPATION: works from home.  PLOF: Independent  PATIENT GOALS: I don't want to wear a pad. Stop leakage.  PERTINENT HISTORY:  HTN, hypothyroid, osteoporsis, depression, hx of appendix removal,  lumbar fusion  Sexual abuse: No  BOWEL MOVEMENT: Pain with bowel movement: No Type of bowel movement:Type (Bristol Stool Scale) types 3-4 75% of the time and types 6 and 7 25% of the time.  Fully empty rectum: Yes:   Leakage: Yes: minimal Pads: Yes: 1-2 pads Fiber supplement: No  URINATION: Pain with urination: No Fully empty bladder: Yes:   Stream: Strong Urgency: Yes: when brushing teeth, performing STS txf and getting home from driving. Frequency: every few hours Leakage: Urge to void, Walking to the bathroom, Coughing, Sneezing, Laughing, and Lifting Pads: Yes: 1-2 pads/day  INTERCOURSE: Pain with intercourse: Initial Penetration Ability to have vaginal penetration:  Yes: but hasn't had intercourse in years Climax: n/a Marinoff Scale: 1/3  PREGNANCY: Vaginal deliveries: 4 Tearing Yes: unsure how many times she had tears.  Fast delivery. C-section deliveries 0 Currently pregnant No  PROLAPSE: Pt was told she had a uterine prolapse when she was having children.    OBJECTIVE:     GAIT: Distance walked: 3/27: still noted to experience decr. Trunk rotation and stride length. Assistive device utilized: None Level of assistance: Complete Independence Comments: back to exam room   POSTURE:  pt with R tx convex curve with pt stating hx of scoliosis and spondylosis. L hip elevated in standing, decr. Lx lordosis.  PELVIC ALIGNMENT: see above  LUMBARAROM/PROM:   A/PROM A/PROM  eval  Flexion WFL  Extension  Limited by ~25%  Right lateral flexion Limited by ~25%  Left lateral flexion Limited by ~25%  Right rotation Limited by ~25%  Left rotation Limited by ~25%   (Blank rows = not tested)  LOWER EXTREMITY MMT:  MMT Right eval Left eval  Hip flexion 5/5 5/5  Hip extension    Hip abduction 3/5 3/5  Hip adduction 3/5 3/5  Hip internal rotation    Hip external rotation    Knee flexion 5/5 5/5  Knee extension 5/5 5/5  Ankle dorsiflexion    Ankle plantarflexion    Ankle inversion    Ankle eversion     (Blank rows = not tested)  LOWER EXTREMITY ROM: all LE WFL  MMT Right eval Left eval  Hip flexion    Hip extension    Hip abduction    Hip adduction    Hip internal rotation    Hip external rotation    Knee flexion    Knee extension    Ankle dorsiflexion    Ankle plantarflexion    Ankle inversion    Ankle eversion     PALPATION: no TTP  Caution used during palpation and PAMs 2/2 pt's hx of osteoporosis. Pt's infrasternal rib angle (IRA) incr. Which can incr. Pressure on abdominals and PFM.   TODAY'S TREATMENT:                                                                                                                              DATE: 02/20/23   NMR:  Sidelying: breath into sides and back, using hand to palpate. Performd HA breath to activate external ab muscles. 5 reps each side.   Lying on your back: Performd HA breath to activate external ab muscles by inhaling, then exhaling HA and bringing hand to the outside of opposite knee. 5 reps each side. Extensive cues and demo for proper technique. Pt noted to use chest muscles for deep inhale.  PATIENT EDUCATION:  Education details: PT educated pt on using a stool for bowel movements as pt hasn't used one yet. PT educated pt on exam findings, IRA angle impact on leakage and HEP. Person educated: Patient Education method: Explanation, Demonstration, Verbal cues, and Handouts Education comprehension: verbalized  understanding, returned demonstration, and needs further education  HOME EXERCISE PROGRAM: Not yet established.  ASSESSMENT:  CLINICAL IMPRESSION: Today's skilled session focused on completing assessment, ROM and posture are impacted by scoliosis. Pt also has an incr. Infrasternal angle which can incr. Pressure on the abdominals and PFM. Pt noted to have weak B hip ab/add which can impact gait, balance, and PFM. Pt demonstrated improved technique of HA exhalation with cues by end of session. The following impairments noted during exam: urge and stress urinary incontinence, impaired SLS balance, weakness likely 2/2 gait deviations, decr. Endurance, decr. ROM. Pt would benefit from skilled PT to address impairments listed above to improve safety and QOL during all ADLs.  OBJECTIVE IMPAIRMENTS: Abnormal gait, decreased balance, decreased coordination, decreased endurance, decreased mobility, decreased ROM, decreased strength, hypomobility, impaired flexibility, and postural dysfunction.   ACTIVITY LIMITATIONS: carrying, lifting, bending, sitting, standing, squatting, sleeping, transfers, continence, toileting, and locomotion level  PARTICIPATION LIMITATIONS: meal prep, cleaning, laundry, community activity, occupation, and yard work  PERSONAL FACTORS: Age, Fitness, and 3+ comorbidities: see above  are also affecting patient's functional outcome.   REHAB POTENTIAL: Good  CLINICAL DECISION MAKING: Stable/uncomplicated  EVALUATION COMPLEXITY: Low   GOALS: Goals reviewed with patient? Yes  SHORT TERM GOALS: Target date: 03/13/23  Pt will be IND in HEP to improve strength, balance and ROM.  Baseline: No HEP Goal status: INITIAL  2.  Complete exam and write goals as indicated. Baseline: limited 2/2 time constraints. Goal status: INITIAL  3.  Perform FOTO and write goals as indicated. Baseline: limited 2/2 time constraints. Goal status: INITIAL  4.  Pt will demo proper posture in  seated and toileing posture to fully empty bladder and reduce strain. Baseline: unable to demo Goal status: INITIAL  LONG TERM GOALS: Target date: 04/10/23  Pt will demonstrate improved PFM coordination with breath during relaxation and contraction in order to improve IAP. Baseline: unable to demo Goal status: INITIAL  2.  Pt will demonstrate improved PFM contraction with breath coordination in order to report wearing 0 pads 2/2 leakage. Baseline: 1-2 pad/day Goal status: INITIAL   PLAN:  PT FREQUENCY: 1x/week  PT DURATION: 8 weeks  PLANNED INTERVENTIONS: Therapeutic exercises, Therapeutic activity, Neuromuscular re-education, Balance training, Gait training, Patient/Family education, Self Care, Joint mobilization, DME instructions, Dry Needling, Spinal mobilization, Cryotherapy, Moist heat, scar mobilization, Biofeedback, Manual therapy, and Re-evaluation  PLAN FOR NEXT SESSION: review HEP and progress.  Yanky Vanderburg L, PT 02/20/2023, 10:18 AM  Geoffry Paradise, PT,DPT 02/20/23 10:18 AM Phone: [phone] Fax: [phone]

## 2023-02-27 ENCOUNTER — Other Ambulatory Visit: Payer: Self-pay

## 2023-02-27 ENCOUNTER — Ambulatory Visit: Payer: Medicare HMO | Attending: Internal Medicine

## 2023-02-27 DIAGNOSIS — R293 Abnormal posture: Secondary | ICD-10-CM | POA: Insufficient documentation

## 2023-02-27 DIAGNOSIS — M6281 Muscle weakness (generalized): Secondary | ICD-10-CM | POA: Diagnosis present

## 2023-02-27 DIAGNOSIS — R278 Other lack of coordination: Secondary | ICD-10-CM

## 2023-02-27 DIAGNOSIS — N393 Stress incontinence (female) (male): Secondary | ICD-10-CM | POA: Diagnosis not present

## 2023-02-27 NOTE — Patient Instructions (Signed)
Lying on your back: Perform HA breath to activate external ab muscles by inhaling, then exhaling HA and bringing hand to the outside of opposite knee. 5 reps each side.

## 2023-02-27 NOTE — Therapy (Signed)
OUTPATIENT PHYSICAL THERAPY FEMALE PELVIC TREATMENT   Patient Name: Erica [name] MRN: [id number] DOB:11/29/1945, 77 y.o., female Today's Date: 02/27/2023  END OF SESSION:  PT End of Session - 02/27/23 1026     Visit Number 3    Number of Visits 9    Date for PT Re-Evaluation 04/14/23    Authorization Type Medicare Advantage    Progress Note Due on Visit 10    PT Start Time 1022    PT Stop Time 1100    PT Time Calculation (min) 38 min    Activity Tolerance Patient tolerated treatment well    Behavior During Therapy [facility] for tasks assessed/performed             Past Medical History:  Diagnosis Date   Depression    Elevated uric acid in blood    Hypertension    Hypothyroid    Osteoporosis    Pain in limb    Past Surgical History:  Procedure Laterality Date   COLONOSCOPY  08/28/12   Needs repeat 3 years   laproscopic appendectomy  09/2012   tubular adenoma   TONSILLECTOMY     Patient Active Problem List   Diagnosis Date Noted   Osteoporosis    Hypertension    Depression    Hypothyroid     PCP: Dr. Farrel Gordon  REFERRING PROVIDER: Dr. Farrel Gordon  REFERRING DIAG: SUI referral date 12/11/22  THERAPY DIAG:  Stress incontinence of urine  Other lack of coordination  Muscle weakness (generalized)  Abnormal posture  Rationale for Evaluation and Treatment: Rehabilitation  ONSET DATE: 12/11/22 referral date   SUBJECTIVE:                                                                                                                                                                                           SUBJECTIVE STATEMENT: Pt noted to sit without crossing LEs. Pt reported not much leakage with urgency, a few times a week. Hasn't performed HEP due to schedule. Pt reported drinking water in the morning has helped.   PAIN:  Are you having pain? No 02/27/23 NPRS scale: 0/10 Pain location:  n/a  Pain type:  Pain description:    Aggravating factors:  Relieving factors:   PRECAUTIONS: None  WEIGHT BEARING RESTRICTIONS: No  FALLS:  Has patient fallen in last 6 months? No  LIVING ENVIRONMENT: Lives with: lives alone Lives in: House/apartment Stairs: Yes: External: 3 steps; none Has following equipment at home: None  OCCUPATION: works from home.  PLOF: Independent  PATIENT GOALS: I don't want to wear a pad. Stop leakage.  PERTINENT HISTORY:  HTN,  hypothyroid, osteoporsis, depression, hx of appendix removal, lumbar fusion  Sexual abuse: No  BOWEL MOVEMENT: Pain with bowel movement: No Type of bowel movement:Type (Bristol Stool Scale) types 3-4 75% of the time and types 6 and 7 25% of the time.  Fully empty rectum: Yes:   Leakage: Yes: minimal Pads: Yes: 1-2 pads Fiber supplement: No  URINATION: Pain with urination: No Fully empty bladder: Yes:   Stream: Strong Urgency: Yes: when brushing teeth, performing STS txf and getting home from driving. Frequency: every few hours Leakage: Urge to void, Walking to the bathroom, Coughing, Sneezing, Laughing, and Lifting Pads: Yes: 1-2 pads/day  INTERCOURSE: Pain with intercourse: Initial Penetration Ability to have vaginal penetration:  Yes: but hasn't had intercourse in years Climax: n/a Marinoff Scale: 1/3  PREGNANCY: Vaginal deliveries: 4 Tearing Yes: unsure how many times she had tears.  Fast delivery. C-section deliveries 0 Currently pregnant No  PROLAPSE: Pt was told she had a uterine prolapse when she was having children.    OBJECTIVE:     GAIT: Distance walked: 4/3: still noted to experience decr. Trunk rotation and stride length. Assistive device utilized: None Level of assistance: Complete Independence Comments: back to exam room   POSTURE:  pt with R tx convex curve with pt stating hx of scoliosis and spondylosis. L hip elevated in standing, decr. Lx lordosis.  PELVIC ALIGNMENT: see above    TODAY'S TREATMENT:                                                                                                                               DATE: 02/27/23   NMR:   Access Code: [email or web address] URL: [email or web address] Date: 02/27/2023 Prepared by: Geoffry Paradise  Exercises - Clam with Resistance  - 1 x daily - 3 x weekly - 3 sets - 10 reps - 1-2 hold Sidelying: clams c and s red band, cues and demo for proper technique. - Supine and seated Diaphragmatic Breathing  - 1 x daily - 7 x weekly - 1 sets - 5 reps. Had difficulty with expanding ribs/belly seated. Lying on your back: Performd HA breath to activate external ab muscles by inhaling, then exhaling HA and bringing hand to the outside of opposite knee. 5 reps each side. Moderate cues and demo for proper technique. Pt noted to use chest muscles for deep inhale intermittently.  Not in HEP: PFM contraction in supine with breath coordination, pt had difficulty coordinating with exhale vs. Inhale. PT able to palpate PFM contraction externally with gloved hand with pt's permission. X10 reps. Will hold off on providing as HEP to ensure proper technique first.   PATIENT EDUCATION:  Education details: PT educated pt on progressed HEP but holding off on PFM contraction for now 2/2 difficulty with coordinating breath. PT educated pt on IAP and PFM/ab tension/pressure/leakage. Person educated: Patient Education method: Explanation, Demonstration, Verbal cues, and Handouts Education comprehension: verbalized understanding, returned demonstration, and needs further education  HOME EXERCISE PROGRAM: Not yet established.  ASSESSMENT:  CLINICAL IMPRESSION: Today's skilled session focused coordinating breath and rib/belly expansion to decr. Pressure on pelvic floor musculature (PFM) and to improve hip strength to decr. Strain on PFM. Pt required cues and demo to coordinate breath with PFM contraction, will hold off on giving this as HEP until coordination improves. Pt  demonstrated progress today as she was able to perform belly/rib expansion vs. Chest on inhale with min-mod cues. The following impairments noted during exam: urge and stress urinary incontinence, impaired SLS balance, weakness likely 2/2 gait deviations, decr. Endurance, decr. ROM. Pt would benefit from skilled PT to address impairments listed above to improve safety and QOL during all ADLs.  OBJECTIVE IMPAIRMENTS: Abnormal gait, decreased balance, decreased coordination, decreased endurance, decreased mobility, decreased ROM, decreased strength, hypomobility, impaired flexibility, and postural dysfunction.   ACTIVITY LIMITATIONS: carrying, lifting, bending, sitting, standing, squatting, sleeping, transfers, continence, toileting, and locomotion level  PARTICIPATION LIMITATIONS: meal prep, cleaning, laundry, community activity, occupation, and yard work  PERSONAL FACTORS: Age, Fitness, and 3+ comorbidities: see above  are also affecting patient's functional outcome.   REHAB POTENTIAL: Good  CLINICAL DECISION MAKING: Stable/uncomplicated  EVALUATION COMPLEXITY: Low   GOALS: Goals reviewed with patient? Yes  SHORT TERM GOALS: Target date: 03/13/23  Pt will be IND in HEP to improve strength, balance and ROM.  Baseline: No HEP Goal status: INITIAL  2.  Complete exam and write goals as indicated. Baseline: limited 2/2 time constraints. Goal status: INITIAL  3.  Perform FOTO and write goals as indicated. Baseline: limited 2/2 time constraints. Goal status: INITIAL  4.  Pt will demo proper posture in seated and toileing posture to fully empty bladder and reduce strain. Baseline: unable to demo Goal status: INITIAL  LONG TERM GOALS: Target date: 04/10/23  Pt will demonstrate improved PFM coordination with breath during relaxation and contraction in order to improve IAP. Baseline: unable to demo Goal status: INITIAL  2.  Pt will demonstrate improved PFM contraction with breath  coordination in order to report wearing 0 pads 2/2 leakage. Baseline: 1-2 pad/day Goal status: INITIAL   PLAN:  PT FREQUENCY: 1x/week  PT DURATION: 8 weeks  PLANNED INTERVENTIONS: Therapeutic exercises, Therapeutic activity, Neuromuscular re-education, Balance training, Gait training, Patient/Family education, Self Care, Joint mobilization, DME instructions, Dry Needling, Spinal mobilization, Cryotherapy, Moist heat, scar mobilization, Biofeedback, Manual therapy, and Re-evaluation  PLAN FOR NEXT SESSION: review HEP and check STGs. Maybe decr. Frequency.  Drakkar Medeiros L, PT 02/27/2023, 10:26 AM  Geoffry Paradise, PT,DPT 02/27/23 10:26 AM Phone: [phone] Fax: [phone]

## 2023-03-06 ENCOUNTER — Other Ambulatory Visit: Payer: Self-pay

## 2023-03-06 ENCOUNTER — Ambulatory Visit: Payer: Medicare HMO

## 2023-03-06 DIAGNOSIS — N393 Stress incontinence (female) (male): Secondary | ICD-10-CM | POA: Diagnosis not present

## 2023-03-06 DIAGNOSIS — R278 Other lack of coordination: Secondary | ICD-10-CM

## 2023-03-06 DIAGNOSIS — M6281 Muscle weakness (generalized): Secondary | ICD-10-CM

## 2023-03-06 DIAGNOSIS — R293 Abnormal posture: Secondary | ICD-10-CM

## 2023-03-06 NOTE — Therapy (Signed)
OUTPATIENT PHYSICAL THERAPY FEMALE PELVIC TREATMENT   Patient Name: Erica [name] MRN: [id number] DOB:[birth year], 77 y.o., female Today's Date: 03/06/2023  END OF SESSION:  PT End of Session - 03/06/23 1024     Visit Number 4    Number of Visits 9    Date for PT Re-Evaluation 04/14/23    Authorization Type Medicare Advantage    Progress Note Due on Visit 10    PT Start Time 1023   pt late   PT Stop Time 1105    PT Time Calculation (min) 42 min    Activity Tolerance Patient tolerated treatment well    Behavior During Therapy [facility] for tasks assessed/performed             Past Medical History:  Diagnosis Date   Depression    Elevated uric acid in blood    Hypertension    Hypothyroid    Osteoporosis    Pain in limb    Past Surgical History:  Procedure Laterality Date   COLONOSCOPY  08/28/12   Needs repeat 3 years   laproscopic appendectomy  09/2012   tubular adenoma   TONSILLECTOMY     Patient Active Problem List   Diagnosis Date Noted   Osteoporosis    Hypertension    Depression    Hypothyroid     PCP: Dr. Sumner Boast  REFERRING PROVIDER: Dr. Sumner Boast  REFERRING DIAG: SUI referral date 12/11/22  THERAPY DIAG:  Stress incontinence of urine  Other lack of coordination  Muscle weakness (generalized)  Abnormal posture  Rationale for Evaluation and Treatment: Rehabilitation  ONSET DATE: 12/11/22 referral date   SUBJECTIVE:                                                                                                                                                                                           SUBJECTIVE STATEMENT: Pt did sit with LEs crossed at knees today. Pt reported urgency has been better, she hasn't been drinking tea now and she used to drink it all day. Pt reported an 80% improvement since beginning PHPT. Pt may be moving to [place], [place]. Towards end of session pt reported difficulty getting up from floor and wanted  to work on strength.   PAIN:  Are you having pain? No 02/27/23 NPRS scale: 0/10 Pain location:  n/a  Pain type:  Pain description:   Aggravating factors:  Relieving factors:   PRECAUTIONS: None  WEIGHT BEARING RESTRICTIONS: No  FALLS:  Has patient fallen in last 6 months? No  LIVING ENVIRONMENT: Lives with: lives alone Lives in: House/apartment Stairs: Yes: External: 3 steps; none Has  following equipment at home: None  OCCUPATION: works from home.  PLOF: Independent  PATIENT GOALS: I don't want to wear a pad. Stop leakage.  PERTINENT HISTORY:  HTN, hypothyroid, osteoporsis, depression, hx of appendix removal, lumbar fusion  Sexual abuse: No  BOWEL MOVEMENT: Pain with bowel movement: No Type of bowel movement:Type (Bristol Stool Scale) types 3-4 75% of the time and types 6 and 7 25% of the time.  Fully empty rectum: Yes:   Leakage: Yes: minimal Pads: Yes: 1-2 pads Fiber supplement: No  URINATION: Pain with urination: No Fully empty bladder: Yes:   Stream: Strong Urgency: Yes: when brushing teeth, performing STS txf and getting home from driving. Frequency: every few hours Leakage: Urge to void, Walking to the bathroom, Coughing, Sneezing, Laughing, and Lifting Pads: Yes: 1-2 pads/day  INTERCOURSE: Pain with intercourse: Initial Penetration Ability to have vaginal penetration:  Yes: but hasn't had intercourse in years Climax: n/a Marinoff Scale: 1/3  PREGNANCY: Vaginal deliveries: 4 Tearing Yes: unsure how many times she had tears.  Fast delivery. C-section deliveries 0 Currently pregnant No  PROLAPSE: Pt was told she had a uterine prolapse when she was having children.    OBJECTIVE:     GAIT: Distance walked: 4/3: still noted to experience decr. Trunk rotation and stride length. Assistive device utilized: None Level of assistance: Complete Independence Comments: back to exam room   POSTURE:  pt with R tx convex curve with pt stating hx of  scoliosis and spondylosis. L hip elevated in standing, decr. Lx lordosis.  PELVIC ALIGNMENT: see above    TODAY'S TREATMENT:                                                                                                                              DATE: 03/06/23    NMR: Exercises Access Code: [email or web address] URL: [email or web address] Date: 03/06/2023 Prepared by: Zerita BoersJennifer Medhansh Brinkmeier   - Clam with Resistance  - 1 x daily - 3 x weekly - 3 sets - 10 reps - 1-2 hold - Supine Diaphragmatic Breathing  - 1 x daily - 7 x weekly - 1 sets - 5 reps - Sidelying Open Book  - 1 x daily - 7 x weekly - 1 sets - 10 reps - Sit to Stand  - 2-3 x daily - 7 x weekly - 1 sets - 10 reps Lying on your back: Performd HA breath to activate external ab muscles by inhaling, then exhaling HA and bringing hand to the outside of opposite knee. 5 reps each side. (Review only). minimal cues and demo for proper technique today.   SELF CARE: PATIENT EDUCATION:  Education details: PT educated on the importance of wearing shoes/sandals with heel strap to reduce foot/LE/PFM tension. PT also discussed how to coordinate breath with STS txf to not incr. IAP. PT discussed reducing frequency based on progress and pt agreeable. Person educated: Patient Education method: Explanation, Demonstration, Verbal cues, and Handouts Education comprehension: verbalized  understanding, returned demonstration, and needs further education  HOME EXERCISE PROGRAM: Not yet established.  ASSESSMENT:  CLINICAL IMPRESSION: Today's skilled session focused progressing HEP, pt demonstrating improvement in glute med strength as she progressed from red band to green band. Pt veers to the R while amb. 2/2 scoliosis (R tx convex curve), therefore, PT provided open book to improve trunk rotation. PT will add PFM contraction with breath next session. Pt met or partially met all STGs, see below for details. The following impairments noted during  exam: urge and stress urinary incontinence, impaired SLS balance, weakness likely 2/2 gait deviations, decr. Endurance, decr. ROM. Pt would benefit from skilled PT to address impairments listed above to improve safety and QOL during all ADLs.  OBJECTIVE IMPAIRMENTS: Abnormal gait, decreased balance, decreased coordination, decreased endurance, decreased mobility, decreased ROM, decreased strength, hypomobility, impaired flexibility, and postural dysfunction.   ACTIVITY LIMITATIONS: carrying, lifting, bending, sitting, standing, squatting, sleeping, transfers, continence, toileting, and locomotion level  PARTICIPATION LIMITATIONS: meal prep, cleaning, laundry, community activity, occupation, and yard work  PERSONAL FACTORS: Age, Fitness, and 3+ comorbidities: see above  are also affecting patient's functional outcome.   REHAB POTENTIAL: Good  CLINICAL DECISION MAKING: Stable/uncomplicated  EVALUATION COMPLEXITY: Low   GOALS: Goals reviewed with patient? Yes  SHORT TERM GOALS: Target date: 03/13/23  Pt will be IND in HEP to improve strength, balance and ROM.  Baseline: No HEP Goal status: MET  2.  Complete exam and write goals as indicated. Baseline: limited 2/2 time constraints. Goal status: MET  3.  Perform FOTO and write goals as indicated. Baseline: limited 2/2 time constraints. Goal status: IN PROGRESS  4.  Pt will demo proper posture in seated and toileing posture to fully empty bladder and reduce strain. Baseline: unable to demo Goal status: IN PROGRESS  LONG TERM GOALS: Target date: 04/10/23  Pt will demonstrate improved PFM coordination with breath during relaxation and contraction in order to improve IAP. Baseline: unable to demo Goal status: INITIAL  2.  Pt will demonstrate improved PFM contraction with breath coordination in order to report wearing 0 pads 2/2 leakage. Baseline: 1-2 pad/day Goal status: INITIAL   PLAN:  PT FREQUENCY: 1x/week  PT DURATION: 8  weeks  PLANNED INTERVENTIONS: Therapeutic exercises, Therapeutic activity, Neuromuscular re-education, Balance training, Gait training, Patient/Family education, Self Care, Joint mobilization, DME instructions, Dry Needling, Spinal mobilization, Cryotherapy, Moist heat, scar mobilization, Biofeedback, Manual therapy, and Re-evaluation  PLAN FOR NEXT SESSION: review HEP and add PFM with breath, floor txfs.  Lemoyne Nestor L, PT 03/06/2023, 11:18 AM  Zerita Boers, PT,DPT 03/06/23 11:18 AM Phone: [phone] Fax: [phone]

## 2023-05-01 ENCOUNTER — Ambulatory Visit: Payer: Medicare HMO | Attending: Internal Medicine

## 2023-05-15 ENCOUNTER — Ambulatory Visit: Payer: Medicare HMO

## 2023-06-11 ENCOUNTER — Other Ambulatory Visit: Payer: Self-pay | Admitting: Ophthalmology

## 2023-06-11 DIAGNOSIS — E079 Disorder of thyroid, unspecified: Secondary | ICD-10-CM

## 2023-06-18 ENCOUNTER — Ambulatory Visit
Admission: RE | Admit: 2023-06-18 | Discharge: 2023-06-18 | Disposition: A | Discharge status: Home / Self Care | Payer: Medicare HMO | Source: Ambulatory Visit | Attending: Ophthalmology | Admitting: Ophthalmology

## 2023-06-18 DIAGNOSIS — H5789 Other specified disorders of eye and adnexa: Secondary | ICD-10-CM | POA: Diagnosis not present

## 2023-06-18 DIAGNOSIS — E079 Disorder of thyroid, unspecified: Secondary | ICD-10-CM | POA: Insufficient documentation

## 2023-06-18 MED ORDER — IOHEXOL 300 MG/ML  SOLN
75.0000 mL | Freq: Once | INTRAMUSCULAR | Status: AC | PRN
  Administered 2023-06-18: 75 mL via INTRAVENOUS

## 2023-07-22 ENCOUNTER — Ambulatory Visit
Admission: RE | Admit: 2023-07-22 | Discharge: 2023-07-22 | Disposition: A | Discharge status: Home / Self Care | Payer: Medicare HMO | Source: Ambulatory Visit | Attending: Internal Medicine | Admitting: Internal Medicine

## 2023-07-22 ENCOUNTER — Other Ambulatory Visit: Payer: Self-pay | Admitting: Internal Medicine

## 2023-07-22 DIAGNOSIS — M545 Low back pain, unspecified: Secondary | ICD-10-CM

## 2023-11-24 IMAGING — CT CT CARDIAC CORONARY ARTERY CALCIUM SCORE
3 series · 14 of 20 positions shown, 16 images · non-contrast
Comparison: CT abdomen 02/04/2013
COMPARISON: CT abdomen 02/04/2013

Addendum:
EXAM:
OVER-READ INTERPRETATION  CT CHEST

The following report is an over-read performed by radiologist Dr.
Seifu Haji Yuye [REDACTED] on 04/04/2022. This over-read
does not include interpretation of cardiac or coronary anatomy or
pathology. The coronary calcium score/coronary CTA interpretation by
the cardiologist is attached.
CLINICAL DATA: Risk stratification
Coronary Calcium Score
TECHNIQUE: The patient was scanned on a Siemens Somatom go.Top Scanner. Axial
non-contrast 3 mm slices were carried out through the heart. The
data set was analyzed on a dedicated work station and scored using
the Agatson method.

[Series 2: sa36 calcium scoring 3.00 · axial · 0.36mm/px · z∈[-1145,-1064]mm · 4 of 46 slices shown]
[im 10/46  vessel]
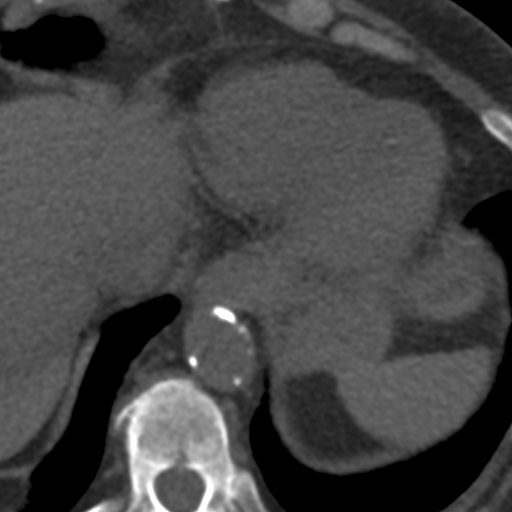
[im 19/46  vessel]
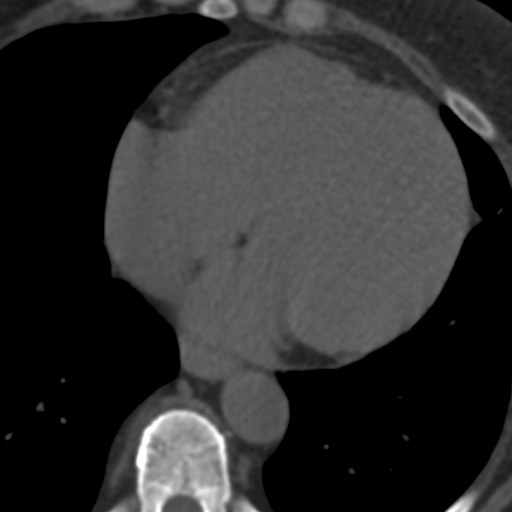
[im 28/46  vessel]
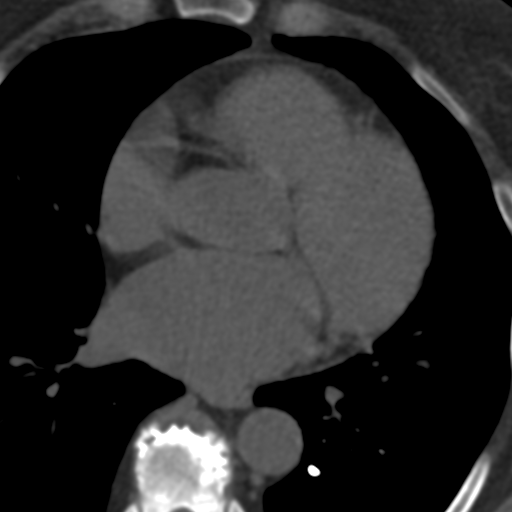
[im 37/46  vessel]
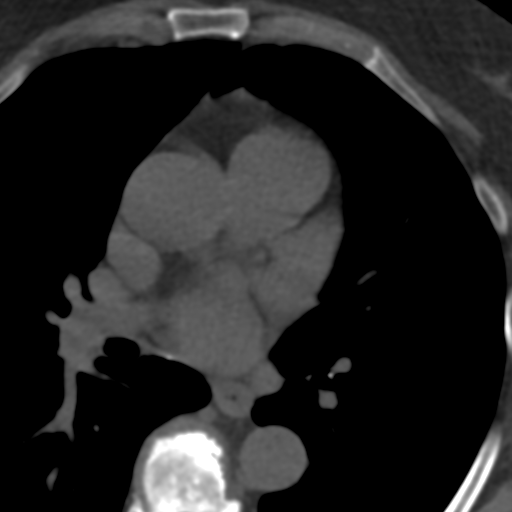

[Series 5: full fov st calcium scoring 3.00 · axial · 0.59mm/px · z∈[-1151,-1061]mm · 5 of 46 slices shown, 7 images]
[im 8/46  vessel]
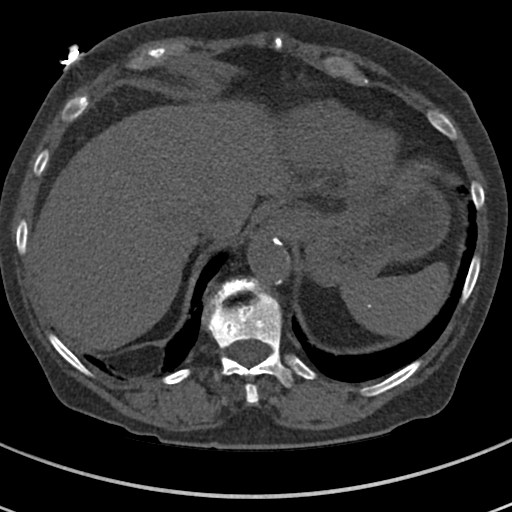
[im 8/46  lung]
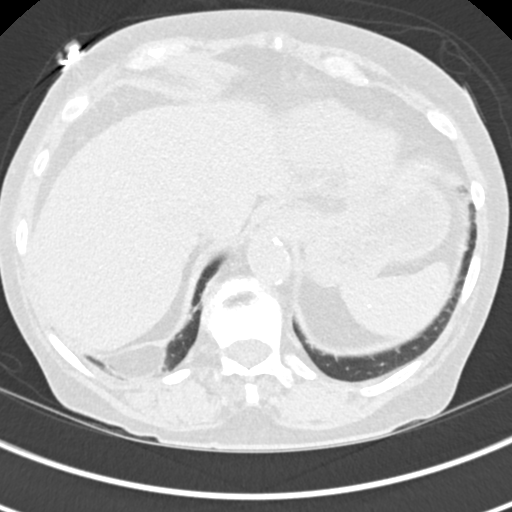
[im 16/46  vessel]
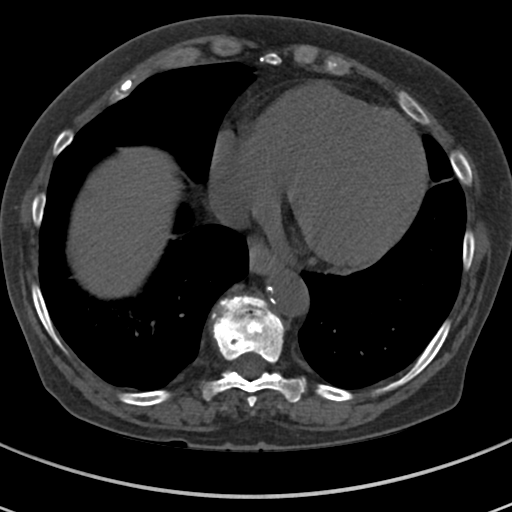
[im 23/46  vessel]
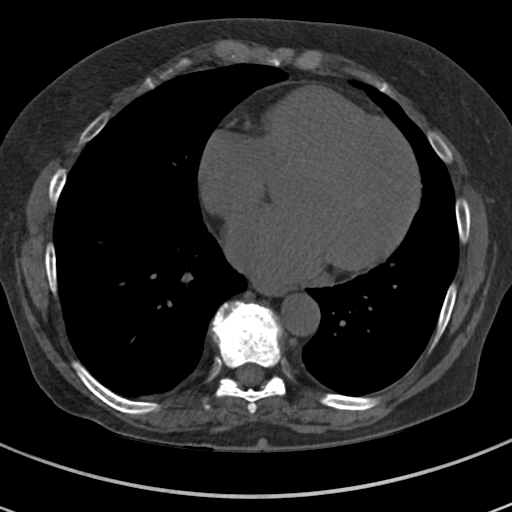
[im 31/46  vessel]
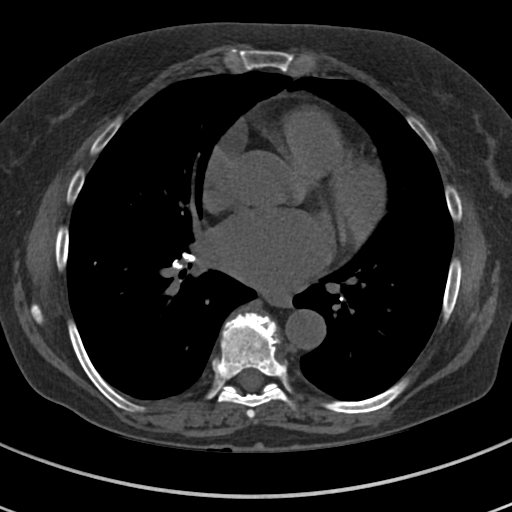
[im 38/46  vessel]
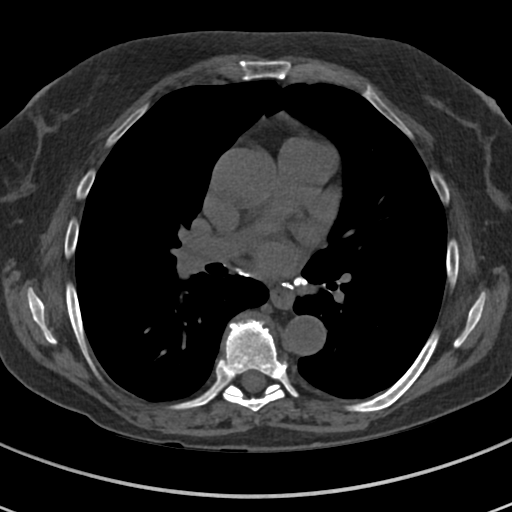
[im 38/46  lung]
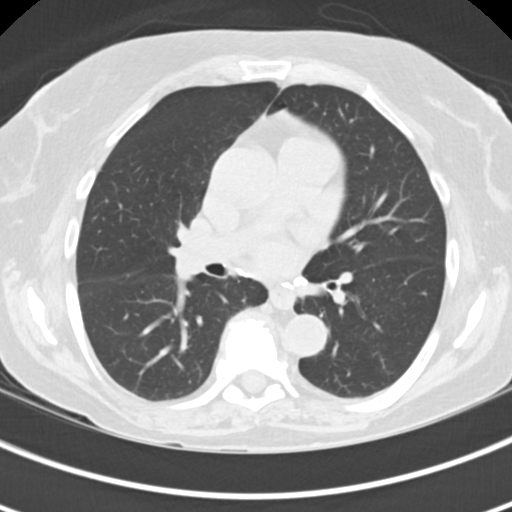

[Series 10: full fov lungs calcium scoring 3.00 ax · axial · 0.59mm/px · z∈[-1151,-1061]mm · 5 of 46 slices shown]
[im 8/46  vessel]
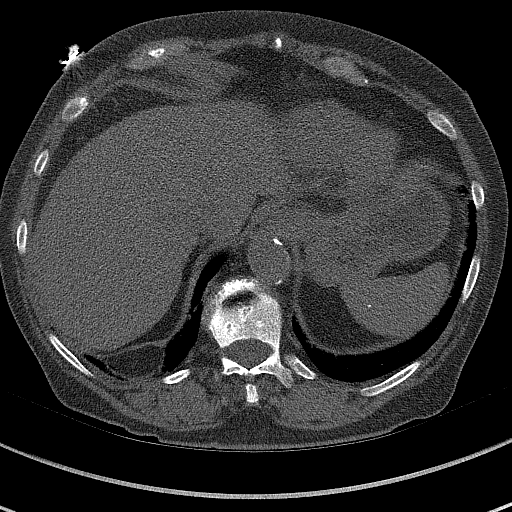
[im 16/46  vessel]
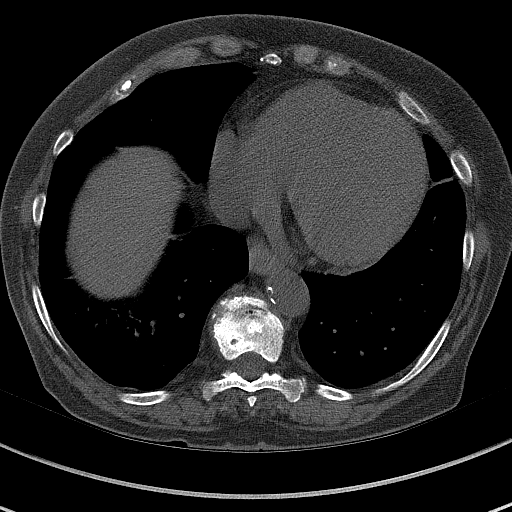
[im 23/46  vessel]
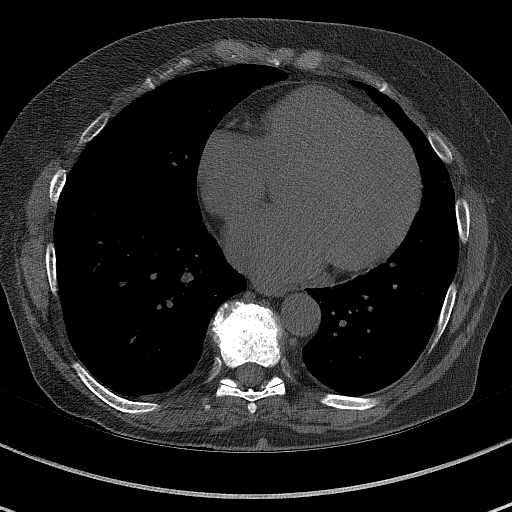
[im 31/46  vessel]
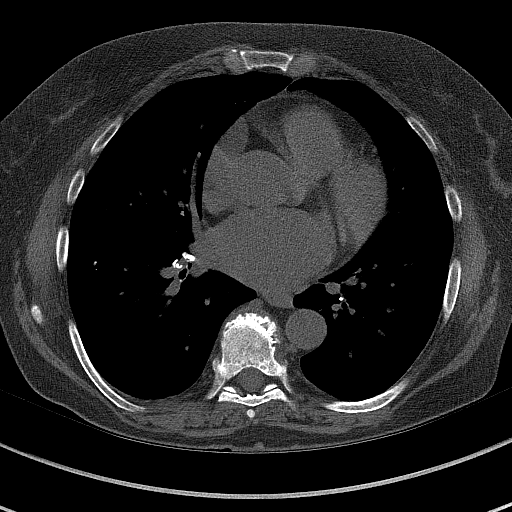
[im 38/46  vessel]
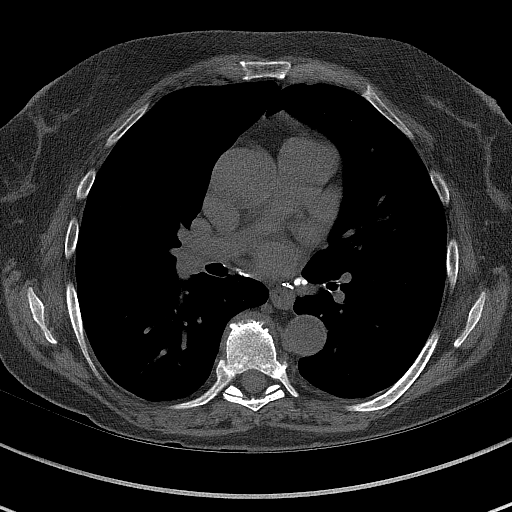

[14 of 20 positions shown; findings below may reference images not displayed]

FINDINGS: Vascular: Normal caliber of the visualized thoracic aorta with
atherosclerotic calcifications.

Mediastinum/Nodes: Scattered mediastinal and hilar calcifications.

Lungs/Pleura: Calcified and noncalcified pulmonary nodules in the
visualized lungs. Largest calcified nodule measures 7 mm on sequence
10 image 29 in the right lower lobe and most compatible with a
calcified granuloma. Noncalcified nodule in the medial right lower
lobe on sequence 2 image 34 measures 4 mm and has slightly enlarged
since 5830. No large areas of airspace disease or consolidation in
the lungs.

Upper Abdomen: Splenic calcifications.

Musculoskeletal: Disc space narrowing and degenerative endplate
changes in lower thoracic spine. No acute bone abnormality.
IMPRESSION: 1. No acute extracardiac findings.
2.  Aortic Atherosclerosis (H2P55-6SD.D).
3. Multiple calcified and noncalcified pulmonary nodules. Evidence
for old granulomatous disease. The small noncalcified nodules are
indeterminate. No follow-up needed if patient is low-risk (and has
no known or suspected primary neoplasm). Non-contrast chest CT can
be considered in 12 months if patient is high-risk. This
recommendation follows the consensus statement: Guidelines for
Management of Incidental Pulmonary Nodules Detected on CT Images:
FINDINGS: Non-cardiac: See separate report from [REDACTED].

Ascending Aorta: Normal size

Pericardium: Normal

Coronary arteries: Normal origin of left and right coronary
arteries. Distribution of arterial calcifications if present, as
noted below;

LM 0

LAD 0

LCx 0

RCA 0

Total 0

IMPRESSION AND RECOMMENDATION:
1. Coronary calcium score of 0. Patient is low risk for coronary
events.

2. CAC 0, LESYA [id number].

3. Continue heart healthy lifestyle and risk factor modification.

*** End of Addendum ***
EXAM:
OVER-READ INTERPRETATION  CT CHEST

The following report is an over-read performed by radiologist Dr.
Seifu Haji Yuye [REDACTED] on 04/04/2022. This over-read
does not include interpretation of cardiac or coronary anatomy or
pathology. The coronary calcium score/coronary CTA interpretation by
the cardiologist is attached.
FINDINGS: Vascular: Normal caliber of the visualized thoracic aorta with
atherosclerotic calcifications.

Mediastinum/Nodes: Scattered mediastinal and hilar calcifications.

Lungs/Pleura: Calcified and noncalcified pulmonary nodules in the
visualized lungs. Largest calcified nodule measures 7 mm on sequence
10 image 29 in the right lower lobe and most compatible with a
calcified granuloma. Noncalcified nodule in the medial right lower
lobe on sequence 2 image 34 measures 4 mm and has slightly enlarged
since 5830. No large areas of airspace disease or consolidation in
the lungs.

Upper Abdomen: Splenic calcifications.

Musculoskeletal: Disc space narrowing and degenerative endplate
changes in lower thoracic spine. No acute bone abnormality.
IMPRESSION: 1. No acute extracardiac findings.
2.  Aortic Atherosclerosis (H2P55-6SD.D).
3. Multiple calcified and noncalcified pulmonary nodules. Evidence
for old granulomatous disease. The small noncalcified nodules are
indeterminate. No follow-up needed if patient is low-risk (and has
no known or suspected primary neoplasm). Non-contrast chest CT can
be considered in 12 months if patient is high-risk. This
recommendation follows the consensus statement: Guidelines for
Management of Incidental Pulmonary Nodules Detected on CT Images:

## 2024-03-09 ENCOUNTER — Other Ambulatory Visit: Payer: Self-pay | Admitting: Student

## 2024-03-09 DIAGNOSIS — I447 Left bundle-branch block, unspecified: Secondary | ICD-10-CM

## 2024-03-09 DIAGNOSIS — R0602 Shortness of breath: Secondary | ICD-10-CM

## 2024-03-16 ENCOUNTER — Encounter: Admission: RE | Admit: 2024-03-16 | Source: Ambulatory Visit

## 2024-04-13 NOTE — Progress Notes (Signed)
 Telemedicine telephone encounter documentation  This telephone encounter was conducted with the patient's (or proxy's) verbal consent via secure, interactive audio telecommunications while in clinic/office/hospital.  The patient (or proxy) was instructed to have this encounter in a suitably private space and to only have persons present to whom they give permission to participate. In addition, patient identity was confirmed by use of name plus two identifiers.  Chief Complaint:   No chief complaint on file.   Subjective:   Erica [name] is a 78 y.o. female established patient. Telephone visit today for: History of Present Illness  HPI 78 yo female with uncontrolled HTN. Hx of LBBB, moderate MR, HLD, CAC score 0 in 2023, stage 3b CKD. Patient here today for f/u from echo/NM stress test  She has had difficulty with BP management recently. Improved over the last few weeks. Continues to have SOB, fatigue. Denies any CP, leg edema, palpitations.   Echo showed normal LV function EF >55% with moderate MR, mild pulmonary HTN overall no significant changes when compared to echo from 2023.   NM stress test not completed per patient. She states she did not feel that she would be able to complete the test due to fatigue.     Patient Active Problem List  Diagnosis  . Essential hypertension  . Abnormal ECG  . Hypercholesterolemia  . Acquired hypothyroidism  . Chronic insomnia  . Hyperuricemia  . Age-related osteoporosis without current pathological fracture  . Gastroesophageal reflux disease  . Mass of neck  . GAD (generalized anxiety disorder)  . Dysphagia  . Allergic rhinitis  . Allergic asthma (HHS-HCC)  . Medicare annual wellness visit, subsequent  . Left bundle branch block  . Nonrheumatic mitral valve regurgitation  . Pharyngeal dysphagia  . Colon polyps  . Eye symptoms  . Skin lesion of scalp  . Acute right-sided low back pain with right-sided sciatica  . Stage 3b  chronic kidney disease (CMS/HHS-HCC)  . Spondylolisthesis of lumbar region  . SOBOE (shortness of breath on exertion)    Outpatient Medications Prior to Visit  Medication Sig Dispense Refill  . azelastine (ASTELIN) 137 mcg nasal spray Place 2 sprays into both nostrils 2 (two) times daily    . benzonatate (TESSALON) 100 MG capsule Take 100 mg by mouth 3 (three) times daily as needed for Cough    . CHLORPHENIRAMINE MALEATE ORAL Take 1 tablet by mouth once daily    . citalopram  (CELEXA ) 20 MG tablet Take 1 tablet (20 mg total) by mouth once daily for 180 days (Patient not taking: Reported on 02/07/2024) 90 tablet 1  . ibandronate (BONIVA) 150 mg tablet Take 1 tablet (150 mg total) by mouth every 30 (thirty) days for 90 days 3 tablet 0  . labetaloL (TRANDATE) 300 MG tablet Take 300 mg by mouth 2 (two) times daily    . levothyroxine  (SYNTHROID ) 88 MCG tablet Take 88 mcg by mouth once daily Take on an empty stomach with a glass of water at least 30-60 minutes before breakfast.    . mirtazapine (REMERON) 15 MG tablet Take 15 mg by mouth at bedtime    . montelukast (SINGULAIR) 10 mg tablet TAKE 1 TABLET (10 MG TOTAL) BY MOUTH NIGHTLY 90 tablet 0  . multivitamin tablet Take 1 tablet by mouth once daily    . NON FORMULARY Take 1 tablet by mouth at bedtime QUERCETIN & BROMELAIN    . olopatadine (PATADAY) 0.2 % ophthalmic solution Place 1 drop into both eyes once daily  2.5 mL 3  . omeprazole  (PRILOSEC) 20 MG DR capsule Take 1 capsule (20 mg total) by mouth once daily as needed 90 capsule 1  . telmisartan (MICARDIS) 80 MG tablet Take 1 tablet (80 mg total) by mouth once daily 90 tablet 3  . traZODone  (DESYREL ) 50 MG tablet TAKE 1 TABLET  NIGHTLY AS NEEDED FOR SLEEP 90 tablet 0   No facility-administered medications prior to visit.    Review of Systems  Constitutional:  Positive for malaise/fatigue.  Respiratory:  Positive for shortness of breath.   Cardiovascular:  Negative for chest pain,  palpitations, orthopnea, leg swelling and PND.     Objective:     Vitals as reported by patient: There were no vitals filed for this visit. There is no height or weight on file to calculate BMI. Patient sounds well today, in no distress , speaking in full sentences  Physical Exam   Results    Assessment/Plan:   Hypertension Stable in the last few weeks per patient. Intolerant to hydralazine, chlorthalidone, amlodipine.  - Continue labetalol 450 mg twice daily. - Continue telmisartan 80 mg daily.  Fatigue/SOB Echo showed normal LV function with moderate MR, no significant changes from echo 2023.  -Discussed potential sleep study to evaluate for sleep apnea.  -Continue f/u with PCP   Hyperlipidemia Rosuvastatin discontinued due to favorable cholesterol levels and no coronary disease on CT calcium score in 2023. Assessment & Plan  There are no diagnoses linked to this encounter.  I spent a total of 35 minutes in both face-to-face and non-face-to-face activities for this visit on the date of this encounter.         Future Appointments     Date/Time Provider Department Center Visit Type   04/13/2024 11:00 AM Erica Alan Jansky, PA [facility] TELEPHONE VISIT (CHARGEABLE)       There are no Patient Instructions on file for this visit.   An after visit summary was provided for the patient either in written format (printed) or through MyChart.  This note has been created using automated tools and reviewed for accuracy by [facility].

## 2024-04-16 ENCOUNTER — Other Ambulatory Visit (HOSPITAL_COMMUNITY): Payer: Self-pay | Admitting: Internal Medicine

## 2024-04-16 DIAGNOSIS — E213 Hyperparathyroidism, unspecified: Secondary | ICD-10-CM

## 2024-04-21 ENCOUNTER — Encounter (HOSPITAL_COMMUNITY)
Admission: RE | Admit: 2024-04-21 | Discharge: 2024-04-21 | Disposition: A | Discharge status: Home / Self Care | Source: Ambulatory Visit | Attending: Internal Medicine | Admitting: Internal Medicine

## 2024-04-21 DIAGNOSIS — E213 Hyperparathyroidism, unspecified: Secondary | ICD-10-CM | POA: Insufficient documentation

## 2024-04-21 MED ORDER — TECHNETIUM TC 99M SESTAMIBI - CARDIOLITE
25.0000 | Freq: Once | INTRAVENOUS | Status: AC | PRN
  Administered 2024-04-21: 27 via INTRAVENOUS

## 2024-05-17 NOTE — Progress Notes (Signed)
 GI Office Note    Referring Provider: Sampson Ethridge DELENA DEWAINE Primary Care Physician:  System, Provider Not In  Primary Gastroenterologist: Lamar HERO.Rourk, MD  Chief Complaint   Chief Complaint  Patient presents with   New Patient (Initial Visit)    Pt referred for nutritional anemia.   History of Present Illness   Erica [name] is a 78 y.o. female presenting today at the request of Entzminger, Ethridge A,* for anemia.  Stress test May 2023 with normal myocardial thickening and wall motion.    Colonoscopy July 2020 at [facility]: Not able to see report in Care Everywhere.  12 lead EKG March 2025 with QTc 506.  Sinus rhythm with first-degree AV block, LBBB.  Echo May 2025 with EF greater than 55% with mild left ventricular hypertrophy.  Grade 1 diastolic dysfunction.  Trivial aortic and pulmonic regurgitation.  Moderate mitral regurgitation and mild tricuspid regurgitation.  Mild pulmonary hypertension.  Per review of paperwork patient has a history of chronic alcohol use, thrombocytopenia, and anemia with macrocytosis.  Reports FOBT negative in May.  Patient reported history of polyps on prior colonoscopies and no reports of hematochezia, did report 1 time of black stool.  Today:  She states a lot of her symptoms have stopped but she was having pain in her trunk and then radiation to her back. Was having pains with eating and drinking. Has some intermittent diarrhea but reports she eats lots of fruits and veggies. This happens every couple of days. Stools are soft and not formed when this happens. May have 3-4 daily when that happens. Sometimes starts solid and then gets looser.  Pretty much has a daily BM, denies hard stools but does not have to strain much. Getting winded with short distances with walking and has to stop frequently. Does not have a lot of dizziness with sitting.   Has not seen brbpr but does report some black stool. The black stool comes and goes. She takes a  multivitamin but no extra iron supplements. Has not had an pepto or kaopectate in a long time.   Has lots of fatigue, weakness and reports she even feels loopy right now.   Reports low thyroid  levels and hyperparathyroidism. She states that her physician says she should have surgery on her parathyroid .   Does not smoke, no vaping. Does drink nightly - 1 drink (vodka and OJ or grapefruit juice).   Denies regular reflux issues. Takes omeprazole  as needed. Has trouble with larger pills but denies overt dysphagia.   Prior note mentions B12 ad folate normal.  MCV 107, no iron overload.   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 Paternal aunt with colon polyps - possibly cancer. Mother with lung cancer.  Denies recent weight loss. Seen cardiology a few months ago. She states she was supposed to go to stress test but does not think she could walk to do it.   Cardiology - [facility] with Madie Alan Favorite PA-C.   Wt Readings from Last 3 Encounters:  05/18/24 155 lb 12.8 oz (70.7 kg)  05/25/15 156 lb (70.8 kg)  11/09/14 154 lb (69.9 kg)    Current Outpatient Medications  Medication Sig Dispense Refill   benazepril  (LOTENSIN ) 40 MG tablet Take 1 tablet (40 mg total) by mouth daily. 90 tablet 4   fluticasone  (FLONASE ) 50 MCG/ACT nasal spray Place 2 sprays into both nostrils daily. 3 g 4   levothyroxine  (SYNTHROID , LEVOTHROID) 75  MCG tablet Take 1 tablet (75 mcg total) by mouth daily before breakfast. 90 tablet 4   omeprazole  (PRILOSEC) 20 MG capsule Take 1 capsule (20 mg total) by mouth daily. (Patient taking differently: Take 1 capsule (20 mg total) by mouth daily.) 90 capsule 4   traZODone  (DESYREL ) 50 MG tablet Take 1 tablet (50 mg total) by mouth at bedtime. (Patient taking differently: Take 1 tablet (50 mg total) by mouth at bedtime.) 90 tablet 4   citalopram  (CELEXA ) 10 MG tablet Take 1 tablet (10 mg total) by mouth  daily. (Patient not taking: Reported on 05/18/2024) 90 tablet 4   hydrochlorothiazide  (HYDRODIURIL ) 25 MG tablet Take 1 tablet (25 mg total) by mouth daily. (Patient not taking: Reported on 02/13/2023) 90 tablet 4   hydrochlorothiazide  (HYDRODIURIL ) 25 MG tablet Take 1 tablet (25 mg total) by mouth daily. (Patient not taking: Reported on 02/13/2023) 30 tablet 3   metoprolol  succinate (TOPROL -XL) 100 MG 24 hr tablet Take 1 tablet (100 mg total) by mouth daily. Take with or immediately following a meal. (Patient not taking: Reported on 05/18/2024) 90 tablet 4   No current facility-administered medications for this visit.    Past Medical History:  Diagnosis Date   Depression    Elevated uric acid in blood    Hypertension    Hypothyroid    Osteoporosis    Pain in limb     Past Surgical History:  Procedure Laterality Date   COLONOSCOPY  08/28/12   Needs repeat 3 years   laproscopic appendectomy  09/2012   tubular adenoma   TONSILLECTOMY      Family History  Problem Relation Age of Onset   Cancer Mother        lung   Diabetes Father    Celiac disease Daughter    Diabetes Paternal Aunt    Diabetes Paternal Uncle    Diabetes Paternal Grandmother    Diabetes Paternal Grandfather    Breast cancer Neg Hx     Allergies as of 05/18/2024 - Review Complete 05/18/2024  Allergen Reaction Noted   Amlodipine Other (See Comments) 04/05/2015   Methadone hcl Rash 04/05/2015    Social History   Socioeconomic History   Marital status: Single    Spouse name: Not on file   Number of children: Not on file   Years of education: Not on file   Highest education level: Not on file  Occupational History   Not on file  Tobacco Use   Smoking status: Former    Current packs/day: 0.00    Types: Cigarettes    Quit date: 05/24/2010    Years since quitting: 13.9   Smokeless tobacco: Never  Substance and Sexual Activity   Alcohol use: Not Currently    Alcohol/week: 1.0 - 2.0 standard drink of  alcohol    Types: 1 - 2 Glasses of wine per week    Comment: white wine daily   Drug use: No   Sexual activity: Not on file  Other Topics Concern   Not on file  Social History Narrative   Not on file   Social Drivers of Health   Financial Resource Strain: Not on file  Food Insecurity: No Food Insecurity (07/29/2020)   Received from [facility]   Hunger Vital Sign    Within the past 12 months, you worried that your food would run out before you got the money to buy more.: Never true    Within the past 12 months, the  food you bought just didn't last and you didn't have money to get more.: Never true  Transportation Needs: No Transportation Needs (07/29/2020)   Received from [facility] - Transportation    In the past 12 months, has lack of transportation kept you from medical appointments or from getting medications?: No    Lack of Transportation (Non-Medical): No  Physical Activity: Inactive (07/29/2020)   Received from [facility]   Exercise Vital Sign    On average, how many days per week do you engage in moderate to strenuous exercise (like a brisk walk)?: 0 days    On average, how many minutes do you engage in exercise at this level?: 0 min  Stress: No Stress Concern Present (07/29/2020)   Received from [facility] - Occupational Stress Questionnaire    Feeling of Stress : Only a little  Social Connections: Not on file  Intimate Partner Violence: Not on file     Review of Systems   Gen: Denies any fever, chills, fatigue, weight loss, lack of appetite.  CV: Denies chest pain, heart palpitations, peripheral edema, syncope.  Resp: Denies shortness of breath at rest or with exertion. Denies wheezing or cough.  GI: see HPI GU : Denies urinary burning, urinary frequency, urinary hesitancy MS: Denies joint pain, muscle weakness, cramps, or limitation of  movement.  Derm: Denies rash, itching, dry skin Psych: Denies depression, anxiety, memory loss, and confusion Heme: Denies bruising, bleeding, and enlarged lymph nodes.  Physical Exam   BP (!) 182/100   Pulse 60   Temp 98.5 F (36.9 C)   Ht 5' 3 (1.6 m)   Wt 155 lb 12.8 oz (70.7 kg)   BMI 27.60 kg/m   General:   Alert and oriented. Pleasant and cooperative. Well-nourished and well-developed.  Head:  Normocephalic and atraumatic. Eyes:  Without icterus, sclera clear and conjunctiva pink.  Ears:  Normal auditory acuity. Mouth:  No deformity or lesions, oral mucosa pink.  Lungs:  Clear to auscultation bilaterally. No wheezes, rales, or rhonchi. No distress.  Heart:  S1, S2 present without murmurs appreciated.  Abdomen:  +BS, soft, non-tender and non-distended. No HSM noted. No guarding or rebound. No masses appreciated.  Rectal:  Deferred  Msk:  Symmetrical without gross deformities. Normal posture. Extremities:  Without edema. Neurologic:  Alert and  oriented x4;  grossly normal neurologically. Skin:  Intact without significant lesions or rashes. Psych:  Alert and cooperative. Normal mood and affect.  Assessment   Erica [name] is a 78 y.o. female with a history of chronic alcohol abuse, CKD stage III AA, osteoporosis, HTN, GERD, COPD, hyperparathyroidism, hypothyroidism, hereditary hemochromatosis, HLD, and mitral valve regurgitation presenting today for evaluation of anemia.  Anemia, hereditary hemochromatosis, fatigue, melena:  - Genetic testing positive for hemochromatosis however PCP note states no iron overload - Referral for anemia however most recent hemoglobin levels not disclosed - Per prior notes it appears that she was having black stools previously but reported to PCP that she was also taking iron at that time.  She describes to me that she is not taking any extra iron currently - Denies any Pepto-Bismol or Kaopectate use. - Describes some intermittent issues with  loose stools however generally no problems with stools and goes daily. - Her biggest complaint is significant fatigue and some intermittent dizziness - Denies any significant reflux symptoms, only takes omeprazole  as needed.  Also  denies any NSAID use. - She reports recent blood work with PCP, will request this lab work and check additional serologies if needed.  She has remote negative celiac panel. - She does admit to regular alcohol use as documented above.  Discussed limiting alcohol intake given fatigue and anemia. - Advise continue PPI use as needed for now. - Discussed the need for EGD and colonoscopy, she is in agreement to proceed however given her history of mitral valve regurgitation, will obtain clearance from cardiology.  Etiologies include CKD, PUD, esophagitis, gastritis, AVMs, malignancy.  - Requesting prior colonoscopy records as well as most recent blood work.   PLAN   Request prior colonoscopy records Request recent lab work.  Labs with CBC, CMP, Vitamin D and iron panel if no recent labs Proceed with upper endoscopy and colonoscopy with propofol by Dr. Shaaron in near future: the risks, benefits, and alternatives have been discussed with the patient in detail. The patient states understanding and desires to proceed. ASA 3 SuFlave or Miralax prep Cardiology clearance Consider CT C/A/P and tumor markers if EGD and colon negative.  Continue omeprazole  as needed Ongoing avoidance of NSAIDs Limit alcohol intake.  Follow up post procedures, about 3 months.   Update: Received colonoscopy report from [facility].  Colonoscopy was July 2020 and she had pancolonic diverticulosis with removal of 2 diminutive polyps.  She was advised to repeat colonoscopy in 7-10 years per guidelines.  Given that we will put her around 57-46 years of age it was advised that the risks may outweigh the benefits at that point and would need to continue with repeat procedure.   Continuing with plan to reach out  to cardiology for clearance/risk stratification as at minimum she needs EGD for evaluation of melena and anemia and given it has been 5 years since her last colonoscopy, colonoscopy is also recommended for evaluation of anemia.  Charmaine Melia, MSN, FNP-BC, AGACNP-BC [facility]

## 2024-05-18 ENCOUNTER — Encounter: Payer: Self-pay | Admitting: Gastroenterology

## 2024-05-18 ENCOUNTER — Ambulatory Visit: Admitting: Gastroenterology

## 2024-05-18 ENCOUNTER — Telehealth: Payer: Self-pay | Admitting: *Deleted

## 2024-05-18 VITALS — BP 191/107 | HR 60 | Temp 98.5°F | Ht 63.0 in | Wt 155.8 lb

## 2024-05-18 DIAGNOSIS — R5383 Other fatigue: Secondary | ICD-10-CM | POA: Diagnosis not present

## 2024-05-18 DIAGNOSIS — D649 Anemia, unspecified: Secondary | ICD-10-CM

## 2024-05-18 DIAGNOSIS — K921 Melena: Secondary | ICD-10-CM

## 2024-05-18 DIAGNOSIS — F1090 Alcohol use, unspecified, uncomplicated: Secondary | ICD-10-CM

## 2024-05-18 NOTE — Telephone Encounter (Signed)
  Request for patient to stop medication prior to procedure or is needing cleareance  05/18/24  Erica [name] 09/05/1946  What type of surgery is being performed? Colonoscopy/EGD  When is surgery scheduled? TBD  What type of clearance is required (medical or pharmacy to hold medication or both? Medical  Patient is needing to have cardiac clearance prior to being scheduled for procedure.   Name of physician performing surgery?  Dr.Robert Shaaron [facility]: [phone] Fax: [phone]  Anethesia type (none, local, MAC, general)? MAC

## 2024-05-18 NOTE — Patient Instructions (Addendum)
.    You scheduled for an upper endoscopy and colonoscopy in the near future with Dr. Shaaron.  We will send you a detailed list of instructions.  We will get you scheduled after we receive clearance from cardiology.  Please fill out and records requested from to get your most recent lab work from [facility] [employer] well as well as an updated med list.  Also get your prior colonoscopy records from [place].  After I receive your most recent blood work I will order any additional labs if needed if they are not recent.  Please continue taking your omeprazole  as needed.  Continue to avoid any NSAIDs (ibuprofen, Advil, Aleve) and aspirin powders(BC/Goody).   If you begin to develop any worsening black/tarry stools or bright red blood in your stools please call the office immediately.  Follow-up after procedures, about 3 months.  It was a pleasure to see you today. I want to create trusting relationships with patients. If you receive a survey regarding your visit,  I greatly appreciate you taking time to fill this out on paper or through your MyChart. I value your feedback.  Charmaine Melia, MSN, FNP-BC, AGACNP-BC [facility]

## 2024-05-27 ENCOUNTER — Encounter: Payer: Self-pay | Admitting: *Deleted

## 2024-05-27 NOTE — Telephone Encounter (Signed)
Clearance faxed x 2 

## 2024-06-02 ENCOUNTER — Telehealth: Payer: Self-pay | Admitting: Gastroenterology

## 2024-06-02 NOTE — Telephone Encounter (Signed)
 Reviewed recent medical records sent by [facility].   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  All labs completed in April, will reach out to patient to see if she would like to have her hemoglobin and iron levels rechecked.  Still agree with original plan for upper endoscopy and colonoscopy, awaiting clearance from cardiology as she has an appointment to discuss this on 7/10.  Charmaine Melia, MSN, APRN, FNP-BC, AGACNP-BC [facility]

## 2024-06-03 ENCOUNTER — Telehealth: Payer: Self-pay

## 2024-06-03 NOTE — Telephone Encounter (Signed)
 Pt called and left a message stating that she has an appt w/ cardiology on Thursday regarding clearance for para thyroid  surgery with Dr. Mavis. Has an appt with Dr. Mavis on 7/17 to discuss that. Pt stated that Dr. Mavis stated that she may need to have colonoscopy and EGD at a more advanced hospital but will find out more when she goes to see him.

## 2024-06-08 NOTE — Telephone Encounter (Signed)
Clearance under media tab.

## 2024-06-09 ENCOUNTER — Telehealth: Payer: Self-pay | Admitting: *Deleted

## 2024-06-09 NOTE — Telephone Encounter (Signed)
 LMOVM to return call  Pt left vm returning call

## 2024-06-09 NOTE — Telephone Encounter (Signed)
 Patient returned call and made aware.   Requested that we send information to PCP.

## 2024-06-09 NOTE — Telephone Encounter (Signed)
 LMOVM to return call  TCS/EGD w/Dr.Rourk, asa 3/4

## 2024-06-09 NOTE — Telephone Encounter (Signed)
 Call placed to patient PCP office and referral coordinator made aware.

## 2024-06-09 NOTE — Telephone Encounter (Signed)
 Received referral from Dr. 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   Appointment scheduled with Dr. Mavis. Provider reviewed referral and reports that patient will need to be referred to [facility] in [place].   Call placed to patient. LMTRC.

## 2024-06-11 ENCOUNTER — Ambulatory Visit: Admitting: General Surgery

## 2024-06-15 NOTE — Telephone Encounter (Signed)
 Pt left VM she has not heard from anyone about her procedure.  Called pt, and LM on named VM

## 2024-06-17 ENCOUNTER — Telehealth: Payer: Self-pay | Admitting: *Deleted

## 2024-06-17 ENCOUNTER — Other Ambulatory Visit: Payer: Self-pay | Admitting: Gastroenterology

## 2024-06-17 DIAGNOSIS — D649 Anemia, unspecified: Secondary | ICD-10-CM

## 2024-06-17 DIAGNOSIS — R5383 Other fatigue: Secondary | ICD-10-CM

## 2024-06-17 NOTE — Telephone Encounter (Signed)
 Pt called and stated that she has not heard from anyone about her referral. She is still waiting to have procedure done. She also wants to have labs done as soon as possible. Please advise.

## 2024-06-17 NOTE — Telephone Encounter (Signed)
 LMOVM to return call.

## 2024-06-19 NOTE — Telephone Encounter (Signed)
LMOM for pt to call office. Also sent pt a MyChart message.

## 2024-06-22 NOTE — Telephone Encounter (Signed)
 Spoke with pt. She wants her lab orders faxed to [facility] in [place] and she will have done. Advised will inform Courtney's nurse to fax to them Fax: [phone]

## 2024-06-22 NOTE — Telephone Encounter (Addendum)
 Spoke with pt. She has been schedled for 8/27 with Dr. Shaaron. Aware will call back with pre-op appt. Instructions to be mailed to her.   Per encounter form give suflav or miralax prep. Suflav not covered. Instructions sent for miralax.

## 2024-06-22 NOTE — Telephone Encounter (Signed)
 Noted. Labs were faxed

## 2024-06-23 ENCOUNTER — Encounter: Payer: Self-pay | Admitting: *Deleted

## 2024-06-29 ENCOUNTER — Other Ambulatory Visit: Payer: Self-pay | Admitting: Internal Medicine

## 2024-06-29 DIAGNOSIS — M810 Age-related osteoporosis without current pathological fracture: Secondary | ICD-10-CM

## 2024-07-20 ENCOUNTER — Encounter (HOSPITAL_COMMUNITY): Payer: Self-pay

## 2024-07-20 ENCOUNTER — Encounter (HOSPITAL_COMMUNITY)
Admission: RE | Admit: 2024-07-20 | Discharge: 2024-07-20 | Disposition: A | Discharge status: Home / Self Care | Source: Ambulatory Visit | Attending: Internal Medicine | Admitting: Internal Medicine

## 2024-07-20 ENCOUNTER — Other Ambulatory Visit: Payer: Self-pay

## 2024-07-20 DIAGNOSIS — Z01810 Encounter for preprocedural cardiovascular examination: Secondary | ICD-10-CM | POA: Diagnosis present

## 2024-07-20 DIAGNOSIS — I10 Essential (primary) hypertension: Secondary | ICD-10-CM | POA: Insufficient documentation

## 2024-07-20 HISTORY — DX: Unspecified asthma, uncomplicated: J45.909

## 2024-07-20 HISTORY — DX: Gastro-esophageal reflux disease without esophagitis: K21.9

## 2024-07-20 HISTORY — DX: Chronic kidney disease, unspecified: N18.9

## 2024-07-20 HISTORY — DX: Anxiety disorder, unspecified: F41.9

## 2024-07-20 NOTE — Patient Instructions (Signed)
 Erica [name]  07/20/2024     @PREFPERIOPPHARMACY @   Your procedure is scheduled on 07/22/2024.   Report to Erica [name] at  0800 A.M.   Call this number if you have problems the morning of surgery:  [phone]  If you experience any cold or flu symptoms such as cough, fever, chills, shortness of breath, etc. between now and your scheduled surgery, please notify us  at the above number.   Remember:  Follow the diet and prep instructions given to you by the office.   You may drink clear liquids until 0600 am on 07/22/2024.    Clear liquids allowed are:                    Water, Juice (No red color; non-citric and without pulp; diabetics please choose diet or no sugar options), Carbonated beverages (diabetics please choose diet or no sugar options), Clear Tea (No creamer, milk, or cream, including half & half and powdered creamer), Black Coffee Only (No creamer, milk or cream, including half & half and powdered creamer), and Clear Sports drink (No red color; diabetics please choose diet or no sugar options)    Take these medicines the morning of surgery with A SIP OF WATER     celexa , diltiazem, labetolol,levothyroxine , prilosec. (Zofran and oxycodone-if needed).    Do not wear jewelry, make-up or nail polish, including gel polish,  artificial nails, or any other type of covering on natural nails (fingers and  toes).  Do not wear lotions, powders, or perfumes, or deodorant.  Do not shave 48 hours prior to surgery.  Men may shave face and neck.  Do not bring valuables to the hospital.  [facility] is not responsible for any belongings or valuables.  Contacts, dentures or bridgework may not be worn into surgery.  Leave your suitcase in the car.  After surgery it may be brought to your room.  For patients admitted to the hospital, discharge time will be determined by your treatment team.  Patients discharged the day of surgery will not be allowed to drive home and must  have someone with them for 24 hours.    Special instructions:   DO NOT smoke tobacco or vape for 24 hours before your procedure.  Please read over the following fact sheets that you were given. Anesthesia Post-op Instructions and Care and Recovery After Surgery      Upper Endoscopy, Adult, Care After After the procedure, it is common to have a sore throat. It is also common to have: Mild stomach pain or discomfort. Bloating. Nausea. Follow these instructions at home: The instructions below may help you care for yourself at home. Your health care provider may give you more instructions. If you have questions, ask your health care provider. If you were given a sedative during the procedure, it can affect you for several hours. Do not drive or operate machinery until your health care provider says that it is safe. If you will be going home right after the procedure, plan to have a responsible adult: Take you home from the hospital or clinic. You will not be allowed to drive. Care for you for the time you are told. Follow instructions from your health care provider about what you may eat and drink. Return to your normal activities as told by your health care provider. Ask your health care provider what activities are safe for you. Take over-the-counter and prescription medicines only  as told by your health care provider. Contact a health care provider if you: Have a sore throat that lasts longer than one day. Have trouble swallowing. Have a fever. Get help right away if you: Vomit blood or your vomit looks like coffee grounds. Have bloody, black, or tarry stools. Have a very bad sore throat or you cannot swallow. Have difficulty breathing or very bad pain in your chest or abdomen. These symptoms may be an emergency. Get help right away. Call 911. Do not wait to see if the symptoms will go away. Do not drive yourself to the hospital. Summary After the procedure, it is common to have a  sore throat, mild stomach discomfort, bloating, and nausea. If you were given a sedative during the procedure, it can affect you for several hours. Do not drive until your health care provider says that it is safe. Follow instructions from your health care provider about what you may eat and drink. Return to your normal activities as told by your health care provider. This information is not intended to replace advice given to you by your health care provider. Make sure you discuss any questions you have with your health care provider. Document Revised: 02/21/2022 Document Reviewed: 02/21/2022 Elsevier Patient Education  2024 [employer]Colonoscopy, Adult, Care After The following information offers guidance on how to care for yourself after your procedure. Your health care provider may also give you more specific instructions. If you have problems or questions, contact your health care provider. What can I expect after the procedure? After the procedure, it is common to have: A small amount of blood in your stool for 24 hours after the procedure. Some gas. Mild cramping or bloating of your abdomen. Follow these instructions at home: Eating and drinking  Drink enough fluid to keep your urine pale yellow. Follow instructions from your health care provider about eating or drinking restrictions. Resume your normal diet as told by your health care provider. Avoid heavy or fried foods that are hard to digest. Activity Rest as told by your health care provider. Avoid sitting for a long time without moving. Get up to take short walks every 1-2 hours. This is important to improve blood flow and breathing. Ask for help if you feel weak or unsteady. Return to your normal activities as told by your health care provider. Ask your health care provider what activities are safe for you. Managing cramping and bloating  Try walking around when you have cramps or feel bloated. If directed, apply heat to  your abdomen as told by your health care provider. Use the heat source that your health care provider recommends, such as a moist heat pack or a heating pad. Place a towel between your skin and the heat source. Leave the heat on for 20-30 minutes. Remove the heat if your skin turns bright red. This is especially important if you are unable to feel pain, heat, or cold. You have a greater risk of getting burned. General instructions If you were given a sedative during the procedure, it can affect you for several hours. Do not drive or operate machinery until your health care provider says that it is safe. For the first 24 hours after the procedure: Do not sign important documents. Do not drink alcohol. Do your regular daily activities at a slower pace than normal. Eat soft foods that are easy to digest. Take over-the-counter and prescription medicines only as told by your health care provider. Keep all follow-up visits. This  is important. Contact a health care provider if: You have blood in your stool 2-3 days after the procedure. Get help right away if: You have more than a small spotting of blood in your stool. You have large blood clots in your stool. You have swelling of your abdomen. You have nausea or vomiting. You have a fever. You have increasing pain in your abdomen that is not relieved with medicine. These symptoms may be an emergency. Get help right away. Call 911. Do not wait to see if the symptoms will go away. Do not drive yourself to the hospital. Summary After the procedure, it is common to have a small amount of blood in your stool. You may also have mild cramping and bloating of your abdomen. If you were given a sedative during the procedure, it can affect you for several hours. Do not drive or operate machinery until your health care provider says that it is safe. Get help right away if you have a lot of blood in your stool, nausea or vomiting, a fever, or increased pain  in your abdomen. This information is not intended to replace advice given to you by your health care provider. Make sure you discuss any questions you have with your health care provider. Document Revised: 12/25/2022 Document Reviewed: 07/05/2021 Elsevier Patient Education  2024 [employer].General Anesthesia, Adult, Care After The following information offers guidance on how to care for yourself after your procedure. Your health care provider may also give you more specific instructions. If you have problems or questions, contact your health care provider. What can I expect after the procedure? After the procedure, it is common for people to: Have pain or discomfort at the IV site. Have nausea or vomiting. Have a sore throat or hoarseness. Have trouble concentrating. Feel cold or chills. Feel weak, sleepy, or tired (fatigue). Have soreness and body aches. These can affect parts of the body that were not involved in surgery. Follow these instructions at home: For the time period you were told by your health care provider:  Rest. Do not participate in activities where you could fall or become injured. Do not drive or use machinery. Do not drink alcohol. Do not take sleeping pills or medicines that cause drowsiness. Do not make important decisions or sign legal documents. Do not take care of children on your own. General instructions Drink enough fluid to keep your urine pale yellow. If you have sleep apnea, surgery and certain medicines can increase your risk for breathing problems. Follow instructions from your health care provider about wearing your sleep device: Anytime you are sleeping, including during daytime naps. While taking prescription pain medicines, sleeping medicines, or medicines that make you drowsy. Return to your normal activities as told by your health care provider. Ask your health care provider what activities are safe for you. Take over-the-counter and  prescription medicines only as told by your health care provider. Do not use any products that contain nicotine or tobacco. These products include cigarettes, chewing tobacco, and vaping devices, such as e-cigarettes. These can delay incision healing after surgery. If you need help quitting, ask your health care provider. Contact a health care provider if: You have nausea or vomiting that does not get better with medicine. You vomit every time you eat or drink. You have pain that does not get better with medicine. You cannot urinate or have bloody urine. You develop a skin rash. You have a fever. Get help right away if: You have trouble  breathing. You have chest pain. You vomit blood. These symptoms may be an emergency. Get help right away. Call 911. Do not wait to see if the symptoms will go away. Do not drive yourself to the hospital. Summary After the procedure, it is common to have a sore throat, hoarseness, nausea, vomiting, or to feel weak, sleepy, or fatigue. For the time period you were told by your health care provider, do not drive or use machinery. Get help right away if you have difficulty breathing, have chest pain, or vomit blood. These symptoms may be an emergency. This information is not intended to replace advice given to you by your health care provider. Make sure you discuss any questions you have with your health care provider. Document Revised: 02/09/2022 Document Reviewed: 02/09/2022 Elsevier Patient Education  2024 [employer].

## 2024-07-22 ENCOUNTER — Encounter (HOSPITAL_COMMUNITY): Payer: Self-pay | Admitting: Internal Medicine

## 2024-07-22 ENCOUNTER — Ambulatory Visit (HOSPITAL_COMMUNITY): Admitting: Certified Registered"

## 2024-07-22 ENCOUNTER — Ambulatory Visit (HOSPITAL_COMMUNITY)
Admission: RE | Admit: 2024-07-22 | Discharge: 2024-07-22 | Disposition: A | Discharge status: Home / Self Care | Attending: Internal Medicine | Admitting: Internal Medicine

## 2024-07-22 ENCOUNTER — Encounter (HOSPITAL_COMMUNITY)
Admission: RE | Disposition: A | Discharge status: Home / Self Care | Payer: Self-pay | Source: Home / Self Care | Attending: Internal Medicine

## 2024-07-22 DIAGNOSIS — K219 Gastro-esophageal reflux disease without esophagitis: Secondary | ICD-10-CM | POA: Insufficient documentation

## 2024-07-22 DIAGNOSIS — K921 Melena: Secondary | ICD-10-CM | POA: Insufficient documentation

## 2024-07-22 DIAGNOSIS — E039 Hypothyroidism, unspecified: Secondary | ICD-10-CM | POA: Insufficient documentation

## 2024-07-22 DIAGNOSIS — Z860101 Personal history of adenomatous and serrated colon polyps: Secondary | ICD-10-CM | POA: Diagnosis not present

## 2024-07-22 DIAGNOSIS — J45909 Unspecified asthma, uncomplicated: Secondary | ICD-10-CM | POA: Diagnosis not present

## 2024-07-22 DIAGNOSIS — N189 Chronic kidney disease, unspecified: Secondary | ICD-10-CM | POA: Diagnosis not present

## 2024-07-22 DIAGNOSIS — D649 Anemia, unspecified: Secondary | ICD-10-CM | POA: Diagnosis not present

## 2024-07-22 DIAGNOSIS — I129 Hypertensive chronic kidney disease with stage 1 through stage 4 chronic kidney disease, or unspecified chronic kidney disease: Secondary | ICD-10-CM | POA: Diagnosis not present

## 2024-07-22 DIAGNOSIS — K5730 Diverticulosis of large intestine without perforation or abscess without bleeding: Secondary | ICD-10-CM | POA: Insufficient documentation

## 2024-07-22 DIAGNOSIS — Z87891 Personal history of nicotine dependence: Secondary | ICD-10-CM

## 2024-07-22 DIAGNOSIS — K2289 Other specified disease of esophagus: Secondary | ICD-10-CM

## 2024-07-22 DIAGNOSIS — I10 Essential (primary) hypertension: Secondary | ICD-10-CM | POA: Diagnosis not present

## 2024-07-22 DIAGNOSIS — K3189 Other diseases of stomach and duodenum: Secondary | ICD-10-CM

## 2024-07-22 DIAGNOSIS — Z1211 Encounter for screening for malignant neoplasm of colon: Secondary | ICD-10-CM | POA: Diagnosis present

## 2024-07-22 HISTORY — PX: ESOPHAGOGASTRODUODENOSCOPY: SHX5428

## 2024-07-22 HISTORY — PX: COLONOSCOPY: SHX5424

## 2024-07-22 SURGERY — COLONOSCOPY
Anesthesia: General

## 2024-07-22 MED ORDER — PROPOFOL 10 MG/ML IV BOLUS
INTRAVENOUS | Status: DC | PRN
  Administered 2024-07-22: 50 mg via INTRAVENOUS
  Administered 2024-07-22: 100 mg via INTRAVENOUS
  Administered 2024-07-22 (×4): 50 mg via INTRAVENOUS
  Administered 2024-07-22: 100 mg via INTRAVENOUS
  Administered 2024-07-22 (×2): 50 mg via INTRAVENOUS

## 2024-07-22 MED ORDER — LIDOCAINE HCL (CARDIAC) PF 100 MG/5ML IV SOSY
PREFILLED_SYRINGE | INTRAVENOUS | Status: DC | PRN
  Administered 2024-07-22: 100 mg via INTRAVENOUS

## 2024-07-22 MED ORDER — LACTATED RINGERS IV SOLN: INTRAVENOUS | Status: DC | PRN

## 2024-07-22 NOTE — Op Note (Signed)
 [facility] Patient Name: Erica [name] Procedure Date: 07/22/2024 9:56 AM MRN: [id number] Date of Birth: 06-25-1946 Attending MD: Lamar Ozell Hollingshead , MD, [id number] CSN: [id number] Age: 78 Admit Type: Outpatient Procedure:                Colonoscopy Indications:              High risk colon cancer surveillance: Personal                            history of colonic polyps Providers:                Lamar Ozell Hollingshead, MD, Jon LABOR. Gerome RN, RN,                            Italy Wilson, Technician Referring MD:              Medicines:                Propofol  per Anesthesia Complications:            No immediate complications. Estimated Blood Loss:     Estimated blood loss: none. Procedure:                Pre-Anesthesia Assessment:                           - Prior to the procedure, a History and Physical                            was performed, and patient medications and                            allergies were reviewed. The patient's tolerance of                            previous anesthesia was also reviewed. The risks                            and benefits of the procedure and the sedation                            options and risks were discussed with the patient.                            All questions were answered, and informed consent                            was obtained. Prior Anticoagulants: The patient has                            taken no anticoagulant or antiplatelet agents. ASA                            Grade Assessment: III - A patient with severe  systemic disease. After reviewing the risks and                            benefits, the patient was deemed in satisfactory                            condition to undergo the procedure.                           After obtaining informed consent, the colonoscope                            was passed under direct vision. Throughout the                            procedure, the  patient's blood pressure, pulse, and                            oxygen saturations were monitored continuously. The                            [id number] ([id number]) Colon was introduced through                            the anus and advanced to the the cecum, identified                            by appendiceal orifice and ileocecal valve. The                            colonoscopy was performed without difficulty. The                            patient tolerated the procedure well. The quality                            of the bowel preparation was adequate. The                            ileocecal valve, appendiceal orifice, and rectum                            were photographed. Scope In: 10:40:51 AM Scope Out: 10:57:30 AM Scope Withdrawal Time: 0 hours 4 minutes 31 seconds  Total Procedure Duration: 0 hours 16 minutes 39 seconds  Findings:      The perianal and digital rectal examinations were normal.      Scattered large-mouthed and small-mouthed diverticula were found in the       entire colon.      The exam was otherwise without abnormality on direct and retroflexion       views. Impression:               - Diverticulosis in the entire examined colon.                           -  The examination was otherwise normal on direct                            and retroflexion views.                           - No specimens collected. Moderate Sedation:      Moderate (conscious) sedation was personally administered by an       anesthesia professional. The following parameters were monitored: oxygen       saturation, heart rate, blood pressure, respiratory rate, EKG, adequacy       of pulmonary ventilation, and response to care. Recommendation:           - Patient has a contact number available for                            emergencies. The signs and symptoms of potential                            delayed complications were discussed with the                            patient. Return  to normal activities tomorrow.                            Written discharge instructions were provided to the                            patient.                           - Advance diet as tolerated.                           - Continue present medications.                           - No repeat colonoscopy due to age.                           - Return to GI clinic in 6 weeks. Procedure Code(s):        --- Professional ---                           [phone], Colonoscopy, flexible; diagnostic, including                            collection of specimen(s) by brushing or washing,                            when performed (separate procedure) Diagnosis Code(s):        --- Professional ---                           Z86.010, Personal history of colonic polyps  K57.30, Diverticulosis of large intestine without                            perforation or abscess without bleeding CPT copyright 2022 American [employer] Association. All rights reserved. The codes documented in this report are preliminary and upon coder review may  be revised to meet current compliance requirements. Lamar HERO. Malayiah Mcbrayer, MD Lamar Ozell Hollingshead, MD 07/22/2024 11:07:14 AM This report has been signed electronically. Number of Addenda: 0

## 2024-07-22 NOTE — Transfer of Care (Signed)
 Immediate Anesthesia Transfer of Care Note  Patient: Erica [name]  Procedure(s) Performed: COLONOSCOPY EGD (ESOPHAGOGASTRODUODENOSCOPY)  Patient Location: Short Stay  Anesthesia Type:General  Level of Consciousness: drowsy, patient cooperative, and responds to stimulation  Airway & Oxygen Therapy: Patient Spontanous Breathing  Post-op Assessment: Report given to RN and Post -op Vital signs reviewed and stable  Post vital signs: Reviewed and stable  Last Vitals:  Vitals Value Taken Time  BP    Temp    Pulse    Resp    SpO2      Last Pain:  Vitals:   07/22/24 1023  PainSc: 2          Complications: No notable events documented.

## 2024-07-22 NOTE — Op Note (Signed)
 [facility] Patient Name: Erica [name] Procedure Date: 07/22/2024 9:56 AM MRN: [id number] Date of Birth: [birth year] Attending MD: Erica Ozell Hollingshead , MD, [id number] CSN: [id number] Age: 78 Admit Type: Outpatient Procedure:                Upper GI endoscopy Indications:              Melena Providers:                Erica Ozell Hollingshead, MD, Jon LABOR. Gerome RN, RN,                            Italy Wilson, Technician Referring MD:              Medicines:                Propofol  per Anesthesia Complications:            No immediate complications. Estimated Blood Loss:     Estimated blood loss was minimal. Procedure:                Pre-Anesthesia Assessment:                           - Prior to the procedure, a History and Physical                            was performed, and patient medications and                            allergies were reviewed. The patient's tolerance of                            previous anesthesia was also reviewed. The risks                            and benefits of the procedure and the sedation                            options and risks were discussed with the patient.                            All questions were answered, and informed consent                            was obtained. Prior Anticoagulants: The patient has                            taken no anticoagulant or antiplatelet agents. ASA                            Grade Assessment: III - A patient with severe                            systemic disease. After reviewing the risks and  benefits, the patient was deemed in satisfactory                            condition to undergo the procedure.                           After obtaining informed consent, the endoscope was                            passed under direct vision. Throughout the                            procedure, the patient's blood pressure, pulse, and                            oxygen saturations  were monitored continuously. The                            [id number] ([id number]) Upper was introduced through                            the mouth, and advanced to the second part of                            duodenum. The upper GI endoscopy was accomplished                            without difficulty. The patient tolerated the                            procedure well. Scope In: 10:27:41 AM Scope Out: 10:34:05 AM Total Procedure Duration: 0 hours 6 minutes 24 seconds  Findings:      Mid esophageal erosions slightly papillomatous appearing esophagus.       Widely patent. No tumor or other abnormality observed.      Patchy erythema polypoid appearance of the gastric mucosa. No ulcer or       infiltrating process seen patent pylorus      The duodenal bulb and second portion of the duodenum were normal.       Biopsies of the esophagus as well as stomach taken for histologic study Impression:               - Abnormal appearing esophagus of uncertain                            clinical significance?"status post biopsy                           -Abnormal stomach of uncertain significance as                            described?"status post biopsy                           -Normal duodenal bulb and second portion of the  duodenum.                           - Moderate Sedation:      Moderate (conscious) sedation was personally administered by an       anesthesia professional. The following parameters were monitored: oxygen       saturation, heart rate, blood pressure, respiratory rate, EKG, adequacy       of pulmonary ventilation, and response to care. Recommendation:           - Patient has a contact number available for                            emergencies. The signs and symptoms of potential                            delayed complications were discussed with the                            patient. Return to normal activities tomorrow.                             Written discharge instructions were provided to the                            patient.                           - Advance diet as tolerated. Continue present                            medications. Follow-up on pathology. Office visit                            in 4 to 6 weeks. See colonoscopy report. Procedure Code(s):        --- Professional ---                           [phone], Esophagogastroduodenoscopy, flexible,                            transoral; diagnostic, including collection of                            specimen(s) by brushing or washing, when performed                            (separate procedure) Diagnosis Code(s):        --- Professional ---                           K92.1, Melena (includes Hematochezia) CPT copyright 2022 American Medical Association. All rights reserved. The codes documented in this report are preliminary and upon coder review may  be revised to meet current compliance requirements. Erica [name]. Erica Donahoe, MD Erica Ozell Hollingshead, MD 07/22/2024 10:39:29 AM This report has been signed electronically. Number of Addenda: 0

## 2024-07-22 NOTE — Discharge Instructions (Addendum)
 EGD Discharge instructions Please read the instructions outlined below and refer to this sheet in the next few weeks. These discharge instructions provide you with general information on caring for yourself after you leave the hospital. Your doctor may also give you specific instructions. While your treatment has been planned according to the most current medical practices available, unavoidable complications occasionally occur. If you have any problems or questions after discharge, please call your doctor. ACTIVITY You may resume your regular activity but move at a slower pace for the next 24 hours.  Take frequent rest periods for the next 24 hours.  Walking will help expel (get rid of) the air and reduce the bloated feeling in your abdomen.  No driving for 24 hours (because of the anesthesia (medicine) used during the test).  You may shower.  Do not sign any important legal documents or operate any machinery for 24 hours (because of the anesthesia used during the test).  NUTRITION Drink plenty of fluids.  You may resume your normal diet.  Begin with a light meal and progress to your normal diet.  Avoid alcoholic beverages for 24 hours or as instructed by your caregiver.  MEDICATIONS You may resume your normal medications unless your caregiver tells you otherwise.  WHAT YOU CAN EXPECT TODAY You may experience abdominal discomfort such as a feeling of fullness or "gas" pains.  FOLLOW-UP Your doctor will discuss the results of your test with you.  SEEK IMMEDIATE MEDICAL ATTENTION IF ANY OF THE FOLLOWING OCCUR: Excessive nausea (feeling sick to your stomach) and/or vomiting.  Severe abdominal pain and distention (swelling).  Trouble swallowing.  Temperature over 101 F (37.8 C).  Rectal bleeding or vomiting of blood.     Colonoscopy Discharge Instructions  Read the instructions outlined below and refer to this sheet in the next few weeks. These discharge instructions provide you with  general information on caring for yourself after you leave the hospital. Your doctor may also give you specific instructions. While your treatment has been planned according to the most current medical practices available, unavoidable complications occasionally occur. If you have any problems or questions after discharge, call Dr. Shaaron at [phone]. ACTIVITY You may resume your regular activity, but move at a slower pace for the next 24 hours.  Take frequent rest periods for the next 24 hours.  Walking will help get rid of the air and reduce the bloated feeling in your belly (abdomen).  No driving for 24 hours (because of the medicine (anesthesia) used during the test).   Do not sign any important legal documents or operate any machinery for 24 hours (because of the anesthesia used during the test).  NUTRITION Drink plenty of fluids.  You may resume your normal diet as instructed by your doctor.  Begin with a light meal and progress to your normal diet. Heavy or fried foods are harder to digest and may make you feel sick to your stomach (nauseated).  Avoid alcoholic beverages for 24 hours or as instructed.  MEDICATIONS You may resume your normal medications unless your doctor tells you otherwise.  WHAT YOU CAN EXPECT TODAY Some feelings of bloating in the abdomen.  Passage of more gas than usual.  Spotting of blood in your stool or on the toilet paper.  IF YOU HAD POLYPS REMOVED DURING THE COLONOSCOPY: No aspirin products for 7 days or as instructed.  No alcohol for 7 days or as instructed.  Eat a soft diet for the next 24 hours.  FINDING OUT THE RESULTS OF YOUR TEST Not all test results are available during your visit. If your test results are not back during the visit, make an appointment with your caregiver to find out the results. Do not assume everything is normal if you have not heard from your caregiver or the medical facility. It is important for you to follow up on all of your test  results.  SEEK IMMEDIATE MEDICAL ATTENTION IF: You have more than a spotting of blood in your stool.  Your belly is swollen (abdominal distention).  You are nauseated or vomiting.  You have a temperature over 101.  You have abdominal pain or discomfort that is severe or gets worse throughout the day.        Your esophagus and stomach appeared a little inflamed.  Biopsies taken.  Your colon appeared normal except for diverticulosis.  Future colonoscopy is not recommended unless new symptoms develop  Further recommendations to follow pending review of lab results.  Office follow-up with Charmaine Melia in 4 to 6 weeks.

## 2024-07-22 NOTE — Anesthesia Preprocedure Evaluation (Signed)
 Anesthesia Evaluation  Patient identified by MRN, date of birth, ID band Patient awake    Reviewed: Allergy & Precautions, H&P , NPO status , Patient's Chart, lab work & pertinent test results, reviewed documented beta blocker date and time   Airway Mallampati: II  TM Distance: >3 FB Neck ROM: full    Dental no notable dental hx.    Pulmonary asthma , former smoker   Pulmonary exam normal breath sounds clear to auscultation       Cardiovascular Exercise Tolerance: Good hypertension,  Rhythm:regular Rate:Normal     Neuro/Psych  PSYCHIATRIC DISORDERS Anxiety Depression    negative neurological ROS     GI/Hepatic Neg liver ROS,GERD  ,,  Endo/Other  Hypothyroidism    Renal/GU Renal disease  negative genitourinary   Musculoskeletal   Abdominal   Peds  Hematology negative hematology ROS (+)   Anesthesia Other Findings   Reproductive/Obstetrics negative OB ROS                              Anesthesia Physical Anesthesia Plan  ASA: 3  Anesthesia Plan: General   Post-op Pain Management:    Induction:   PONV Risk Score and Plan: Propofol  infusion  Airway Management Planned:   Additional Equipment:   Intra-op Plan:   Post-operative Plan:   Informed Consent: I have reviewed the patients History and Physical, chart, labs and discussed the procedure including the risks, benefits and alternatives for the proposed anesthesia with the patient or authorized representative who has indicated his/her understanding and acceptance.     Dental Advisory Given  Plan Discussed with: CRNA  Anesthesia Plan Comments:         Anesthesia Quick Evaluation

## 2024-07-22 NOTE — Anesthesia Postprocedure Evaluation (Signed)
 Anesthesia Post Note  Patient: Erica [name]  Procedure(s) Performed: COLONOSCOPY EGD (ESOPHAGOGASTRODUODENOSCOPY)  Patient location during evaluation: Phase II Anesthesia Type: General Level of consciousness: patient cooperative and responds to stimulation Pain management: pain level controlled Vital Signs Assessment: post-procedure vital signs reviewed and stable Respiratory status: spontaneous breathing Cardiovascular status: stable Anesthetic complications: no   No notable events documented.   Last Vitals:  Vitals:   07/22/24 0901  BP: (!) 151/73  Resp: 19  Temp: 36.6 C  SpO2: 99%    Last Pain:  Vitals:   07/22/24 1023  PainSc: 2                  Adine LITTIE Anger

## 2024-07-22 NOTE — H&P (Signed)
 @LOGO @   Primary Care Physician:  Sampson Ethridge LABOR, MD Primary Gastroenterologist:  Dr. Shaaron  Pre-Procedure History & Physical: HPI:  Erica [name] is a 78 y.o. female here for  further evaluation of anemia possible melena.  History of colonic polyps via EGD and colonoscopy.  Past Medical History:  Diagnosis Date   Anxiety    Asthma    Chronic kidney disease    Depression    Elevated uric acid in blood    GERD (gastroesophageal reflux disease)    Hypertension    Hypothyroid    Osteoporosis    Pain in limb     Past Surgical History:  Procedure Laterality Date   COLONOSCOPY  08/28/2012   Needs repeat 3 years   laproscopic appendectomy  09/26/2012   tubular adenoma   LUMBAR FUSION     TONSILLECTOMY      Prior to Admission medications   Medication Sig Start Date End Date Taking? Authorizing Provider  albuterol (VENTOLIN HFA) 108 (90 Base) MCG/ACT inhaler 2 puffs. 04/27/24  Yes [provider]  chlorthalidone (HYGROTON) 25 MG tablet Take 25 mg by mouth daily. 12/01/19  Yes [provider]  diclofenac (VOLTAREN) 75 MG EC tablet Take 75 mg by mouth 2 (two) times daily.   Yes [provider]  fluticasone  (FLONASE ) 50 MCG/ACT nasal spray Place 2 sprays into both nostrils daily. 05/25/15  Yes Crissman, Oneil LABOR, MD  labetalol (NORMODYNE) 300 MG tablet Take by mouth.   Yes [provider]  levocetirizine (XYZAL) 5 MG tablet TAKE 1 TABLET EVERY EVENINGAS NEEDED FOR ALLERGY      SYMPTOMS 02/20/24  Yes [provider]  levothyroxine  (SYNTHROID , LEVOTHROID) 75 MCG tablet Take 1 tablet (75 mcg total) by mouth daily before breakfast. 05/25/15  Yes Crissman, Oneil LABOR, MD  montelukast (SINGULAIR) 10 MG tablet TAKE 1 TABLET NIGHTLY FOR  ALLERGIES/ASTHMA 12/01/19  Yes [provider]  Multiple Vitamin (MULTIVITAMIN) LIQD Take 5 mLs by mouth daily.   Yes [provider]  omeprazole  (PRILOSEC) 20 MG capsule Take 1 capsule (20 mg total)  by mouth daily. Patient taking differently: Take 1 capsule (20 mg total) by mouth daily. 05/25/15  Yes Montell Oneil LABOR, MD  ondansetron (ZOFRAN-ODT) 4 MG disintegrating tablet 1 tablet on the tongue and allow to dissolve Orally every 12 hours for 15 days as needed for nausea 03/20/24  Yes [provider]  oxyCODONE (OXY IR/ROXICODONE) 5 MG immediate release tablet Take by mouth. 01/07/24  Yes [provider]  telmisartan-hydrochlorothiazide  (MICARDIS HCT) 80-12.5 MG tablet Take by mouth.   Yes [provider]  traZODone  (DESYREL ) 50 MG tablet Take 1 tablet (50 mg total) by mouth at bedtime. Patient taking differently: Take 1 tablet (50 mg total) by mouth at bedtime. 05/25/15  Yes Crissman, Oneil LABOR, MD  citalopram  (CELEXA ) 10 MG tablet Take 1 tablet (10 mg total) by mouth daily. Patient not taking: Reported on 05/18/2024 05/25/15   Montell Oneil LABOR, MD  diltiazem (CARDIZEM) 120 MG tablet Take 120 mg by mouth daily. as directed    [provider]    Allergies as of 06/22/2024 - Review Complete 05/18/2024  Allergen Reaction Noted   Amlodipine Other (See Comments) 04/05/2015   Methadone hcl Rash 04/05/2015    Family History  Problem Relation Age of Onset   Cancer Mother        lung   Diabetes Father    Celiac disease Daughter    Diabetes Paternal Aunt  Diabetes Paternal Uncle    Diabetes Paternal Grandmother    Diabetes Paternal Grandfather    Breast cancer Neg Hx     Social History   Socioeconomic History   Marital status: Single    Spouse name: Not on file   Number of children: Not on file   Years of education: Not on file   Highest education level: Not on file  Occupational History   Not on file  Tobacco Use   Smoking status: Former    Current packs/day: 0.00    Types: Cigarettes    Quit date: 05/24/2010    Years since quitting: 14.1   Smokeless tobacco: Never  Vaping Use   Vaping status: Never Used  Substance and Sexual Activity    Alcohol use: Not Currently    Alcohol/week: 1.0 - 2.0 standard drink of alcohol    Types: 1 - 2 Glasses of wine per week    Comment: white wine daily   Drug use: No   Sexual activity: Not on file  Other Topics Concern   Not on file  Social History Narrative   Not on file   Social Drivers of Health   Financial Resource Strain: Not on file  Food Insecurity: No Food Insecurity (07/29/2020)   Received from [facility]   Hunger Vital Sign    Within the past 12 months, you worried that your food would run out before you got the money to buy more.: Never true    Within the past 12 months, the food you bought just didn't last and you didn't have money to get more.: Never true  Transportation Needs: No Transportation Needs (07/29/2020)   Received from [facility] - Transportation    In the past 12 months, has lack of transportation kept you from medical appointments or from getting medications?: No    Lack of Transportation (Non-Medical): No  Physical Activity: Inactive (07/29/2020)   Received from [facility]   Exercise Vital Sign    On average, how many days per week do you engage in moderate to strenuous exercise (like a brisk walk)?: 0 days    On average, how many minutes do you engage in exercise at this level?: 0 min  Stress: No Stress Concern Present (07/29/2020)   Received from [facility] - Occupational Stress Questionnaire    Feeling of Stress : Only a little  Social Connections: Not on file  Intimate Partner Violence: Not on file    Review of Systems: See HPI, otherwise negative ROS  Physical Exam: BP (!) 151/73 (BP Location: Right Arm)   Temp 97.9 F (36.6 C)   Resp 19   SpO2 99%  General:   Alert,  Well-developed, well-nourished, pleasant and cooperative in NAD Neck:  Supple; no masses or thyromegaly. No significant cervical adenopathy. Lungs:  Clear  throughout to auscultation.   No wheezes, crackles, or rhonchi. No acute distress. Heart:  Regular rate and rhythm; no murmurs, clicks, rubs,  or gallops. Abdomen: Non-distended, normal bowel sounds.  Soft and nontender without appreciable mass or hepatosplenomegaly.   Impression/Plan:    78 year old lady presents with possible melena, anemia.  Plan for EGD and colonoscopy today.  History of colonic adenomas previously removed elsewhere.    Denies dysphagia.  Diagnostic EGD and colonoscopy per plan.  The risks, benefits, limitations, imponderables and alternatives regarding both EGD and colonoscopy have been reviewed with  the patient. Questions have been answered. All parties agreeable.       Notice: This dictation was prepared with Dragon dictation along with smaller phrase technology. Any transcriptional errors that result from this process are unintentional and may not be corrected upon review.

## 2024-07-23 ENCOUNTER — Encounter (HOSPITAL_COMMUNITY): Payer: Self-pay | Admitting: Internal Medicine

## 2024-07-23 LAB — SURGICAL PATHOLOGY

## 2024-09-03 ENCOUNTER — Telehealth: Payer: Self-pay | Admitting: *Deleted

## 2024-09-03 NOTE — Telephone Encounter (Signed)
 Pt called and states that she has not heard from anyone after her procedure. She states that she is still having lower abdominal pains and diarrhea. She wants to know what is the next step.

## 2024-09-10 NOTE — Telephone Encounter (Signed)
 Erica [name] will be back tomorrow and she can address as she was aware of complaints yesterday. Report of black stool is not new. She reported it in June. Her Hgb normal in 06/2024.   I reviewed recent colonoscopy (diverticulosis) and EGD (mild esophageal erosions and slightly papillomatous and polypoid erythematous stomach with benign biopsies).  Please have Erica [name] address tomorrow.

## 2024-09-10 NOTE — Telephone Encounter (Signed)
 Forwarding to you in absence of Charmaine Melia, NP. Pt called again today stating that she wanted results from procedures that she had done months ago.I informed her of Charmaine Mahon's recommendations. She stated that was not good enough, because it did not explain what she should be doing for her problems.  She stated that she read results in MyChart and she does not understand them. She can not wait until her OV on 10 /27, because she is still having black stools and lower abdominal pain. She would like to know what can be done for her anemia, melena and diverticulosis. Please advise.

## 2024-09-14 ENCOUNTER — Telehealth: Payer: Self-pay | Admitting: Internal Medicine

## 2024-09-14 NOTE — Telephone Encounter (Signed)
 Telephone communication noted.  Not sure why patient did not get report on esophageal and gastric biopsies both of which were negative.  Agree with omeprazole  every day not as needed.  She still complaining of dark stools lets do an updated CBC.  She should have office follow-up very soon (6 weeks from her procedures)

## 2024-09-16 ENCOUNTER — Other Ambulatory Visit: Payer: Self-pay | Admitting: *Deleted

## 2024-09-16 DIAGNOSIS — D649 Anemia, unspecified: Secondary | ICD-10-CM

## 2024-09-16 NOTE — Telephone Encounter (Signed)
Spoke to pt, informed her of results and recommendations. Pt voiced understanding.  

## 2024-09-18 ENCOUNTER — Telehealth: Payer: Self-pay | Admitting: Gastroenterology

## 2024-09-18 NOTE — Telephone Encounter (Signed)
 Reviewed labs in media tab.  Most recent lab work with hemoglobin 12.5 on 10/22.

## 2024-09-21 ENCOUNTER — Encounter: Payer: Self-pay | Admitting: Gastroenterology

## 2024-09-21 ENCOUNTER — Ambulatory Visit: Admitting: Gastroenterology

## 2024-09-21 VITALS — BP 139/83 | HR 72 | Temp 99.0°F | Ht 63.0 in | Wt 161.2 lb

## 2024-09-21 DIAGNOSIS — Z9089 Acquired absence of other organs: Secondary | ICD-10-CM

## 2024-09-21 DIAGNOSIS — D509 Iron deficiency anemia, unspecified: Secondary | ICD-10-CM | POA: Diagnosis not present

## 2024-09-21 DIAGNOSIS — K219 Gastro-esophageal reflux disease without esophagitis: Secondary | ICD-10-CM

## 2024-09-21 DIAGNOSIS — K921 Melena: Secondary | ICD-10-CM

## 2024-09-21 DIAGNOSIS — D649 Anemia, unspecified: Secondary | ICD-10-CM

## 2024-09-21 DIAGNOSIS — R5383 Other fatigue: Secondary | ICD-10-CM

## 2024-09-21 DIAGNOSIS — K529 Noninfective gastroenteritis and colitis, unspecified: Secondary | ICD-10-CM

## 2024-09-21 DIAGNOSIS — R197 Diarrhea, unspecified: Secondary | ICD-10-CM

## 2024-09-21 DIAGNOSIS — Z9889 Other specified postprocedural states: Secondary | ICD-10-CM

## 2024-09-21 NOTE — Progress Notes (Signed)
 GI Office Note    Referring Provider: Sampson Ethridge DELENA DEWAINE Primary Care Physician:  Sampson Ethridge DELENA, MD Primary Gastroenterologist: Lamar HERO.Rourk, MD  Date:  09/21/2024  ID:  Merrie Epler Erica [name], DOB Apr 25, 1946, MRN [id number]   Chief Complaint   Chief Complaint  Patient presents with   Follow-up    Follow up. Needs results of procedures and wants to know what is the next route to take.    History of Present Illness  Amberly Livas Popwell is a 78 y.o. female with a history of chronic alcohol abuse, CKD stage III, osteoporosis, HTN, GERD, COPD, hyperparathyroidism, hypothyroidism, hereditary hemochromatosis, HLD, mitral valve regurgitation presenting today for follow-up on her anemia as well as ongoing complaints of melena.  Colonoscopy was July 2020 at [facility]: - pancolonic diverticulosis with removal of 2 diminutive polyps.  - She was advised to repeat colonoscopy in 7-10 years per guidelines  ( Given that we will put her around 49-40 years of age it was advised that the risks may outweigh the benefits at that point and would need to continue with repeat procedure. )   Stress test May 2023 with normal myocardial thickening and wall motion.    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  Sinus rhythm with first-degree AV block, LBBB.   Echo May 2025 with EF greater than 55% with mild left ventricular hypertrophy.  Grade 1 diastolic dysfunction.  Trivial aortic and pulmonic regurgitation.  Moderate mitral regurgitation and mild tricuspid regurgitation.  Mild pulmonary hypertension.   Per review of paperwork patient has a history of chronic alcohol use, thrombocytopenia, and anemia with macrocytosis.  Reports FOBT negative in May.  Patient reported history of polyps on  prior colonoscopies and no reports of hematochezia, did report 1 time of black stool.  Last office visit 05/18/2024.  Reported pain in her trunk and radiation to her back, pains with eating and drinking as well as intermittent diarrhea although eats lots of fruits and veggies.  This diarrhea is happening every few days with stools that are soft and not formed.  Having 3-4 daily.  Has at least 1 bowel movement daily and denies hard stools but did note some occasional need for straining.  Getting winded with activity.  Mostly complains of fatigue and weakness.  Also reporting some black stools which seems to come and go.  Denied any regular reflux issues although only takes omeprazole  as needed.  Does have trouble swallowing larger pills.  States she is due to have surgery for her parathyroid  soon.  Denied any recent weight loss. Requested prior colonoscopy records and recent lab work.  Labs ordered including CBC, CMP, vitamin D, iron panel if she had not had recent labs.  Scheduled for EGD and colonoscopy.  Advised consider CT of the chest abdomen pelvis as well as tumor markers if EGD and colonoscopy negative.  Continue omeprazole  as needed, avoid NSAIDs, limit alcohol intake.  Labs 06/26/2024: Hemoglobin 12.4, platelets 173, creatinine 1.2, parathyroid  hormone 82, vitamin D 53  Colonoscopy 07/22/2024: - Diverticulosis in the entire examined colon.  - The examination was otherwise normal on direct and retroflexion views.  - No specimens collected. - No repeat recommended due to age.  Upper endoscopy 07/22/2024: - Abnormal appearing esophagus of uncertain clinical significance? status post biopsy  -  Abnormal stomach of uncertain significance as described? status post biopsy  - Normal duodenal bulb and second portion of the duodenum. - Path: Benign gastric mucosa with no specific pathology, negative for H. pylori. - Given ongoing reports of melena, recommended follow-up CBC on 10/20.  Labs 09/11/2024  with creatinine 0.9, BUN 33, potassium 4.5, sodium 138, GFR 66  Labs 09/16/24: Hemoglobin 12.5.  Today:  Discussed the use of AI scribe software for clinical note transcription with the patient, who gave verbal consent to proceed.  She has experienced persistent diarrhea for several days, with episodes occurring five to six times a day. Initially, the stools were black, but they have changed to brown in the last few days. Associated symptoms include nausea, weakness, and lethargy. She experiences abdominal pain when eating, described as a sensation of food moving down her digestive tract. Her appetite has decreased today, although it is usually good.  Her medical history includes diverticulosis, which she was told was identified during a colonoscopy. She reports that her upper endoscopy was abnormal, but she was told that biopsies did not show significant inflammation or H. pylori. She is currently taking omeprazole  daily, although she does not experience heartburn. Her hemoglobin level was recently measured at 12.5, which is within normal range, despite previous anemia.  She underwent parathyroid  surgery on August 22, 2024, where two lobes were removed, resulting in a decrease in her calcium levels. She felt more energetic immediately after the surgery but has since experienced fatigue again.  Her social history includes drinking hibiscus tea throughout the day and consuming vodka with fruit juice at night. She denies tobacco use. She has a history of appendix removal but no other abdominal surgeries. She reports difficulty swallowing large pills, such as multivitamins.  No chest pain, shortness of breath, dizziness, or passing out. She confirms fatigue and ongoing diarrhea, with occasional abdominal pain and urgency but no accidents. No significant weight loss.   Wt Readings from Last 6 Encounters:  09/21/24 161 lb 3.2 oz (73.1 kg)  07/20/24 (P) 155 lb 13.8 oz (70.7 kg)  05/18/24 155 lb  12.8 oz (70.7 kg)  05/25/15 156 lb (70.8 kg)  11/09/14 154 lb (69.9 kg)    Body mass index is 28.56 kg/m.   Current Outpatient Medications  Medication Sig Dispense Refill   albuterol (VENTOLIN HFA) 108 (90 Base) MCG/ACT inhaler 2 puffs.     chlorthalidone (HYGROTON) 25 MG tablet Take 25 mg by mouth daily.     diclofenac (VOLTAREN) 75 MG EC tablet Take 75 mg by mouth 2 (two) times daily.     diltiazem (CARDIZEM) 120 MG tablet Take 120 mg by mouth daily. as directed     fluticasone  (FLONASE ) 50 MCG/ACT nasal spray Place 2 sprays into both nostrils daily. 3 g 4   labetalol (NORMODYNE) 300 MG tablet Take by mouth.     levocetirizine (XYZAL) 5 MG tablet TAKE 1 TABLET EVERY EVENINGAS NEEDED FOR ALLERGY      SYMPTOMS     levothyroxine  (SYNTHROID , LEVOTHROID) 75 MCG tablet Take 1 tablet (75 mcg total) by mouth daily before breakfast. 90 tablet 4   montelukast (SINGULAIR) 10 MG tablet TAKE 1 TABLET NIGHTLY FOR  ALLERGIES/ASTHMA     Multiple Vitamin (MULTIVITAMIN) LIQD Take 5 mLs by mouth daily.     omeprazole  (PRILOSEC) 20 MG capsule Take 1 capsule (20 mg total) by mouth daily. 90 capsule 4   ondansetron (ZOFRAN-ODT) 4 MG disintegrating tablet 1 tablet on the tongue and allow  to dissolve Orally every 12 hours for 15 days as needed for nausea     telmisartan (MICARDIS) 80 MG tablet Take 80 mg by mouth daily.     oxyCODONE (OXY IR/ROXICODONE) 5 MG immediate release tablet Take by mouth. (Patient not taking: Reported on 09/21/2024)     telmisartan-hydrochlorothiazide  (MICARDIS HCT) 80-12.5 MG tablet Take by mouth. (Patient not taking: Reported on 09/21/2024)     traZODone  (DESYREL ) 50 MG tablet Take 1 tablet (50 mg total) by mouth at bedtime. (Patient taking differently: Take 1 tablet (50 mg total) by mouth at bedtime.) 90 tablet 4   No current facility-administered medications for this visit.    Past Medical History:  Diagnosis Date   Anxiety    Asthma    Chronic kidney disease     Depression    Elevated uric acid in blood    GERD (gastroesophageal reflux disease)    Hypertension    Hypothyroid    Osteoporosis    Pain in limb     Past Surgical History:  Procedure Laterality Date   COLONOSCOPY  08/28/2012   Needs repeat 3 years   COLONOSCOPY N/A 07/22/2024   Procedure: COLONOSCOPY;  Surgeon: Shaaron Lamar HERO, MD;  [facility];  Service: Endoscopy;  Laterality: N/A;  10:00am, asa 3-4   ESOPHAGOGASTRODUODENOSCOPY N/A 07/22/2024   Procedure: EGD (ESOPHAGOGASTRODUODENOSCOPY);  Surgeon: Shaaron Lamar HERO, MD;  Location: [facility];  Service: Endoscopy;  Laterality: N/A;   laproscopic appendectomy  09/26/2012   tubular adenoma   LUMBAR FUSION     TONSILLECTOMY      Family History  Problem Relation Age of Onset   Cancer Mother        lung   Diabetes Father    Celiac disease Daughter    Diabetes Paternal Aunt    Diabetes Paternal Uncle    Diabetes Paternal Grandmother    Diabetes Paternal Grandfather    Breast cancer Neg Hx     Allergies as of 09/21/2024 - Review Complete 09/21/2024  Allergen Reaction Noted   Amlodipine Other (See Comments) 04/05/2015   Methadone hcl Rash 04/05/2015    Social History   Socioeconomic History   Marital status: Single    Spouse name: Not on file   Number of children: Not on file   Years of education: Not on file   Highest education level: Not on file  Occupational History   Not on file  Tobacco Use   Smoking status: Former    Current packs/day: 0.00    Types: Cigarettes    Quit date: 05/24/2010    Years since quitting: 14.3   Smokeless tobacco: Never  Vaping Use   Vaping status: Never Used  Substance and Sexual Activity   Alcohol use: Not Currently    Alcohol/week: 1.0 - 2.0 standard drink of alcohol    Types: 1 - 2 Glasses of wine per week    Comment: white wine daily   Drug use: No   Sexual activity: Not on file  Other Topics Concern   Not on file  Social History Narrative   Not on file    Social Drivers of Health   Financial Resource Strain: Not on file  Food Insecurity: No Food Insecurity (07/29/2020)   Received from [facility]   Hunger Vital Sign    Within the past 12 months, you worried that your food would run out before you got the money to buy more.: Never true  Within the past 12 months, the food you bought just didn't last and you didn't have money to get more.: Never true  Transportation Needs: No Transportation Needs (07/29/2020)   Received from [facility] - Transportation    In the past 12 months, has lack of transportation kept you from medical appointments or from getting medications?: No    Lack of Transportation (Non-Medical): No  Physical Activity: Inactive (07/29/2020)   Received from [facility]   Exercise Vital Sign    On average, how many days per week do you engage in moderate to strenuous exercise (like a brisk walk)?: 0 days    On average, how many minutes do you engage in exercise at this level?: 0 min  Stress: No Stress Concern Present (07/29/2020)   Received from [facility] Occupational [facility] - Occupational Stress Questionnaire    Feeling of Stress : Only a little  Social Connections: Not on file    Review of Systems   Gen:+ fatigue. Denies fever, chills, anorexia. Denies weakness, weight loss.  CV: Denies chest pain, palpitations, syncope, peripheral edema, and claudication. Resp: Denies dyspnea at rest, cough, wheezing, coughing up blood, and pleurisy. GI: See HPI Derm: Denies rash, itching, dry skin Psych: Denies depression, anxiety, memory loss, confusion. No homicidal or suicidal ideation.  Heme: Denies bruising, bleeding, and enlarged lymph nodes.  Physical Exam   BP 139/83 (BP Location: Right Arm, Patient Position: Sitting, Cuff Size: Normal)   Pulse 72   Temp 99 F (37.2 C) (Temporal)   Ht 5' 3 (1.6 m)   Wt 161 lb 3.2  oz (73.1 kg)   BMI 28.56 kg/m   General:   Alert and oriented. No distress noted. Pleasant and cooperative.  Head:  Normocephalic and atraumatic. Eyes:  Conjuctiva clear without scleral icterus. Mouth:  Oral mucosa pink and moist. Good dentition. No lesions. Abdomen:  +BS, soft, non-tender and non-distended. No rebound or guarding. No HSM or masses noted. Rectal: Deferred Msk:  Symmetrical without gross deformities. Normal posture. Extremities:  Without edema. Neurologic:  Alert and  oriented x4 Psych:  Alert and cooperative. Normal mood and affect.  Assessment & Plan  Emerlyn Mehlhoff Spielberg is a 78 y.o. female presenting today with request to discuss her recent procedure result and lab results and discuss next steps in evaluation of her anemia, black stools, and fatigue.  Iron deficiency anemia with fatigue and melena Intermittent melena with recent improvement to brown stools the last few days. Hemoglobin improved to 12.5, within normal range. Differential includes minor oozing from higher GI tract, possibly related to mild esophageal abnormalities. Anemia may be related to minor bleeding with potential etiologies being small bowel AVMs vs alcohol gastritis, duodenitis, or minor inflammation secondary to alcohol and NSAIDs. EGD with abnormal esophageal mucosa with negative biopsies and normal duodenum. Colonoscopy unremarkable.  - Ordered capsule endoscopy to evaluate small bowel for potential sources of bleeding. - Will recheck iron levels now given stable Hgb recent checked - Complete eval with celiac screen.  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 Differential includes pancreatic insufficiency, microscopic colitis, alpha-gal syndrome, and celiac disease. Recent colonoscopy normal,  but microscopic colitis remains a possibility, biopsies not obtained at last colonoscopy. No significant weight  loss. Symptoms may be exacerbated by dietary factors. (possible lactose intolerance). IBS remains within differential as well. If labs normal, then may consider dicyclomine vs Xifaxin for treatment of IBS-D. - Ordered stool samples to evaluate pancreatic function and inflammatory markers. - Ordered blood tests for alpha-gal syndrome and celiac disease. - Consider Imodium 1-2 mg daily for symptom management if needed. - Consideration for dicyclomine vs Xifaxin pending workup if overall unremarkable.  - Consider further eval for IHO, SIBO with breath test in the future.   Gastroesophageal reflux disease GERD with mild esophageal abnormalities on endoscopy. No significant inflammation or H. pylori infection. Symptoms include nausea and abdominal pain, possibly related to dietary irritants such as alcohol and acidic fruit juices. Omeprazole  prescribed to reduce acid levels and prevent reflux damage. - Start omeprazole  20 mg daily before breakfast. - Increase omeprazole  to 40 mg daily if symptoms persist after a few weeks. - GERD diet  Status post parathyroidectomy Recent parathyroidectomy with removal of two lobes. Calcium levels normalized post-surgery. Fatigue may improve as endocrine system adjusts post-surgery. - Continue to monitor calcium levels and symptoms of fatigue. - Continue to follow with surgeon  Follow up   Follow up 3 months.    Charmaine Melia, MSN, FNP-BC, AGACNP-BC [facility]

## 2024-09-21 NOTE — Patient Instructions (Addendum)
 I have ordered labs and stool studies for you to complete in an effort to further evaluate your anemia and your diarrhea. Please have blood work completed at [facility].  We will call you with results once they have been received. Please allow 3-5 business days for review. Location for [facility] in [place]:              [address], [place]   We will get you scheduled for givens capsule in the near future to further evaluate for your black stools and anemia.  I need for you to start taking your omeprazole  20 mg once daily on empty stomach, preferably at breakfast but if you need to wait until just before lunch (at least 30 minutes) given your need to take your thyroid  medication in the morning on empty stomach that would be fine.  If you are still noticing some occasional issues with diarrhea or nausea or ongoing black stool then please let me know and we can increase to 40 mg.  Given your black stools it is very important that you avoid any NSAIDs (ibuprofen, Aleve, Advil, Motrin, meloxicam, Celebrex, and even diclofenac) as these all can cause inflammation in the esophagus, stomach, and small bowel.  Alcohol and acidic juices are other things that you consume on a regular basis that can contribute to black stool as well.  Is very important that you begin limiting this with a goal of completely stopping intake of alcohol.  Once you have completed the capsule, it may take a several days to build to read it and then discuss anything with the doctor before we get back to you, as soon as that have completed reading it we will be in touch with those results and any recommendations.  Please be mindful about your intake and monitoring your symptoms afterward, keeping a diary if you need to to see if you can correlate any of your symptoms with any certain types of foods.  We may consider testing for SIBO via breath test pending the results of your capsule study and your labs and stool studies.  If  you are having significant amounts of diarrhea, you may take 1 or 2 mg of Imodium once a day and monitor your bowel frequency, holding if you are constipated.  Follow-up 3 months.  It was a pleasure to see you today. I want to create trusting relationships with patients. If you receive a survey regarding your visit,  I greatly appreciate you taking time to fill this out on paper or through your MyChart. I value your feedback.  Charmaine Melia, MSN, FNP-BC, AGACNP-BC [facility]

## 2024-09-22 ENCOUNTER — Telehealth: Payer: Self-pay | Admitting: *Deleted

## 2024-09-22 ENCOUNTER — Encounter: Payer: Self-pay | Admitting: *Deleted

## 2024-09-22 NOTE — Telephone Encounter (Signed)
 LMOVM to return call  Givens Capsule needs to be scheduled.

## 2024-09-22 NOTE — Telephone Encounter (Signed)
 Pt has been scheduled for Monday 10/05/24 for GIVENS. Instructions sent via mychart.

## 2024-09-29 ENCOUNTER — Telehealth: Payer: Self-pay | Admitting: Gastroenterology

## 2024-09-29 LAB — GI PROFILE, STOOL, PCR

## 2024-09-29 NOTE — Telephone Encounter (Addendum)
 Received some portion of labs ordered at her last visit.  Iron panel is within normal limits. No deficiency and no evidence of iron overload (history of hemachromatosis).   Alpha gal panel negative.   Still awaiting stool studies and celiac panel results.    Addendum 09/29/24 12:16pm: Received celiac labs.  Celiac panel negative.

## 2024-09-30 ENCOUNTER — Ambulatory Visit: Payer: Self-pay | Admitting: Gastroenterology

## 2024-09-30 LAB — SPECIMEN STATUS REPORT

## 2024-09-30 LAB — CLOSTRIDIUM DIFFICILE BY PCR: Toxigenic C. Difficile by PCR: NEGATIVE

## 2024-10-02 ENCOUNTER — Other Ambulatory Visit: Payer: Self-pay | Admitting: *Deleted

## 2024-10-02 DIAGNOSIS — R197 Diarrhea, unspecified: Secondary | ICD-10-CM

## 2024-10-02 DIAGNOSIS — D649 Anemia, unspecified: Secondary | ICD-10-CM

## 2024-10-02 DIAGNOSIS — K921 Melena: Secondary | ICD-10-CM

## 2024-10-02 LAB — PANCREATIC ELASTASE, FECAL: Pancreatic Elastase, Fecal: 419 ug Elast./g (ref >200)

## 2024-10-02 LAB — SPECIMEN STATUS REPORT

## 2024-10-02 LAB — CALPROTECTIN, FECAL: Calprotectin, Fecal: 444 ug/g — AB (ref 0–120)

## 2024-10-02 LAB — GI PROFILE, STOOL, PCR

## 2024-10-05 ENCOUNTER — Telehealth: Payer: Self-pay | Admitting: Gastroenterology

## 2024-10-05 ENCOUNTER — Other Ambulatory Visit: Payer: Self-pay | Admitting: Gastroenterology

## 2024-10-05 ENCOUNTER — Ambulatory Visit (HOSPITAL_COMMUNITY)
Admission: RE | Admit: 2024-10-05 | Discharge: 2024-10-05 | Disposition: A | Discharge status: Home / Self Care | Attending: Internal Medicine | Admitting: Internal Medicine

## 2024-10-05 ENCOUNTER — Encounter (HOSPITAL_COMMUNITY)
Admission: RE | Disposition: A | Discharge status: Home / Self Care | Payer: Self-pay | Source: Home / Self Care | Attending: Internal Medicine

## 2024-10-05 DIAGNOSIS — R197 Diarrhea, unspecified: Secondary | ICD-10-CM

## 2024-10-05 HISTORY — PX: GIVENS CAPSULE STUDY: SHX5432

## 2024-10-05 SURGERY — IMAGING PROCEDURE, GI TRACT, INTRALUMINAL, VIA CAPSULE

## 2024-10-05 NOTE — Telephone Encounter (Signed)
 Spoke to pt and she will come by the office and pick up lab requisitions for the stools.

## 2024-10-05 NOTE — Telephone Encounter (Signed)
 Spoke with [facility] on the phone.  They state that the stool sample was canceled given she was provided an orange pack or some sort of orange labeling in which the stool should have been present within that in order for them to be able to run the test which is why it was previously canceled.  If she does not have this large patio stool sample then she needs to go by [facility] and pick that up and then drop the stool sample back off to them within that packaging.  While she is here in [place] if she would like to go back to [facility] on [address] to pick that up she can.  Charmaine Melia, MSN, APRN, FNP-BC, AGACNP-BC [facility]

## 2024-10-05 NOTE — Telephone Encounter (Signed)
 Noted. I called pt and [facility] and took care of it.

## 2024-10-06 ENCOUNTER — Ambulatory Visit: Admitting: Internal Medicine

## 2024-10-06 ENCOUNTER — Encounter (HOSPITAL_COMMUNITY): Payer: Self-pay | Admitting: Internal Medicine

## 2024-10-07 ENCOUNTER — Ambulatory Visit: Payer: Self-pay | Admitting: Gastroenterology

## 2024-10-07 LAB — GI PROFILE, STOOL, PCR
Adenovirus F 40/41: NOT DETECTED
Astrovirus: NOT DETECTED
C difficile toxin A/B: NOT DETECTED
Campylobacter: DETECTED — AB
Cryptosporidium: NOT DETECTED
Cyclospora cayetanensis: NOT DETECTED
Entamoeba histolytica: NOT DETECTED
Enteroaggregative E coli: NOT DETECTED
Enteropathogenic E coli: NOT DETECTED
Enterotoxigenic E coli: NOT DETECTED
Giardia lamblia: NOT DETECTED
Norovirus GI/GII: DETECTED — AB
Plesiomonas shigelloides: NOT DETECTED
Rotavirus A: NOT DETECTED
Salmonella: NOT DETECTED
Sapovirus: NOT DETECTED
Shiga-toxin-producing E coli: NOT DETECTED
Shigella/Enteroinvasive E coli: NOT DETECTED
Vibrio cholerae: NOT DETECTED
Vibrio: NOT DETECTED
Yersinia enterocolitica: NOT DETECTED

## 2024-10-09 NOTE — Op Note (Signed)
 Small Bowel Givens Capsule Study Procedure date:  10/05/2024  Referring Provider:  Charmaine Melia, MSN, APRN, FNP-BC, AGACNP-BC PCP:  Dr. Sampson, Ethridge LABOR, MD  Indication for procedure: Iron deficiency anemia and melena with history of frequent NSAID and alcohol use.  Patient data:  Wt: 73 kg Ht: 5' 3  Findings:  Poor prep.  Scattered gastric contents throughout the small bowel.  Stomach full of food particles precluding most visualization of the stomach.  First small bowel image identified at 5 hours 14 minutes and 57 seconds into the study with subsequent images shortly after showing mild erythema, scattered abrasions, and lymphangiectasia's.  Quick small bowel passage time.  Other small erosions and erythema noted at 5 hours 29 minutes and 26 seconds into the study.  The views are significantly intermittently obscured by bowel content and partially undigested food particles obscuring views.  No overt active bleeding identified throughout the study.  First Gastric image:  00:00:45 First Duodenal image: 05:14:57 First Ileo-Cecal Valve image: 07:11:21 First Cecal image: 07:12:32 Gastric Passage time: 5h 68m Small Bowel Passage time:  1h 55m  Summary & Recommendations: Poor quality exam with prolonged time within the stomach and quick small bowel passage time. There were some identifiable areas of erosions and erythema shortly into the small bowel however difficult to assess for any findings given the degree of chime present. No overt blood identified throughout the exam.   She will need repeat capsule study with extended prep and potentially reglan use or placement via EGD. I will discuss both options with the patient but lean more toward EGD placement to ensure adequate study.   She should continue PPI and close follow up with hematology.   Charmaine Melia, MSN, APRN, FNP-BC, AGACNP-BC [facility] at [facility]

## 2024-10-15 ENCOUNTER — Telehealth: Payer: Self-pay | Admitting: *Deleted

## 2024-10-15 NOTE — Telephone Encounter (Signed)
 Medford Fischer from [facility] call about pt's campylobacter results. He wanted to know if we had informed the pt and giving her recommendations. I informed him that she was informed. He asked did I know where she got it from and when her symptoms started. I read him the office note. Informed him if he needed anything else he should call back when provider was here. He voiced understanding

## 2024-10-19 NOTE — Telephone Encounter (Signed)
 Noted that Is what I informed Medford Fischer from [facility].

## 2024-10-21 ENCOUNTER — Telehealth: Payer: Self-pay | Admitting: Gastroenterology

## 2024-10-21 ENCOUNTER — Encounter: Payer: Self-pay | Admitting: *Deleted

## 2024-10-21 NOTE — Telephone Encounter (Signed)
 added

## 2024-10-21 NOTE — Telephone Encounter (Signed)
 Spoke with patient about the findings of her capsule study that although it was complete to the cecum it was not a good prep and had significant amounts of gastric contents throughout her stomach and small bowel obscuring views and spent an extended period of time within the stomach with evidence of food present. There were some erosion etc noted in the stomach but these were seen on EGD.   In talking with her it appears that she did eat a light meal at the last timeframe that was recommended based off of the instructions (8 PM) the night before.  The capsule sat in her stomach for over 4 hours.   Given this I discussed repeating her capsule with her but it being placed via EGD.  I discussed with her an extended prep as well. She is agreeable.   I recommend that the day before she only do clear liquids as part of her prep for capsule placement via EGD. Dx: anemia and melena.   Charmaine Melia, MSN, APRN, FNP-BC, AGACNP-BC [facility] at [facility]

## 2024-10-21 NOTE — Telephone Encounter (Signed)
 What asa is she?

## 2024-10-21 NOTE — Telephone Encounter (Signed)
 Pt has been scheduled for 10/28/24. Instructions sent via mychart. She says she might not be able to do the day she is scheduled, she will call to reschedule if it doesn't work for her.

## 2024-10-26 ENCOUNTER — Telehealth: Payer: Self-pay | Admitting: *Deleted

## 2024-10-26 ENCOUNTER — Encounter (HOSPITAL_COMMUNITY)
Admission: RE | Admit: 2024-10-26 | Discharge: 2024-10-26 | Disposition: A | Source: Ambulatory Visit | Attending: Internal Medicine

## 2024-10-26 NOTE — Telephone Encounter (Signed)
 Patient called in to cancel EGD/GIVENS with Dr. Shaaron 12/3. Stated her transportation cancelled on her and wants to reschedule in January. Aware will call once we get that schedule.

## 2024-10-28 ENCOUNTER — Ambulatory Visit (HOSPITAL_COMMUNITY): Admission: RE | Admit: 2024-10-28 | Source: Home / Self Care | Admitting: Internal Medicine

## 2024-10-28 ENCOUNTER — Encounter (HOSPITAL_COMMUNITY): Admission: RE | Payer: Self-pay | Source: Home / Self Care

## 2024-10-28 SURGERY — EGD (ESOPHAGOGASTRODUODENOSCOPY)
Anesthesia: Choice

## 2024-11-05 NOTE — Telephone Encounter (Signed)
LMOVM to call back to reschedule 

## 2024-11-10 NOTE — Telephone Encounter (Signed)
 LMOVM to call back. Mychart message also sent and letter mailed

## 2024-11-18 ENCOUNTER — Encounter: Payer: Self-pay | Admitting: Internal Medicine

## 2024-12-10 ENCOUNTER — Telehealth: Payer: Self-pay | Admitting: *Deleted

## 2024-12-10 NOTE — Telephone Encounter (Signed)
 Pt called to reschedule her EGD/GIVENS. She says it was supposed to have been done while she was admitted to hospital but was unsuccessful. She has an office on 12/28/24 for follow up. Does she need to come in or is it ok to reschedule her? Please advise. Thank you

## 2024-12-15 NOTE — Telephone Encounter (Signed)
 noted

## 2024-12-17 ENCOUNTER — Telehealth: Payer: Self-pay | Admitting: Internal Medicine

## 2024-12-17 NOTE — Telephone Encounter (Signed)
 LMOVM to return call.

## 2024-12-18 NOTE — Telephone Encounter (Signed)
 Spoke to pt about getting her schedule for her EGD/GIVENS. She says she was under the impression that she would stay at the hospital after the procedure is done and she is wanting to know what the procedure entails. She requested that we speak to provider to get clarification. Please advise. Thank you.

## 2024-12-22 NOTE — Telephone Encounter (Signed)
 Pt informed of providers message. She was given dates for her procedure and states she has to check with her driver to see what day would work best for her.

## 2024-12-25 NOTE — Telephone Encounter (Signed)
 ERROR

## 2024-12-28 ENCOUNTER — Ambulatory Visit: Admitting: Internal Medicine

## 2025-01-19 ENCOUNTER — Ambulatory Visit: Admitting: Internal Medicine
